# Patient Record
Sex: Female | Born: 1935 | Race: Black or African American | Hispanic: No | Smoking: Never smoker
Health system: Southern US, Community
[De-identification: ages and names within clinical notes are randomized; demographics above are authoritative.]

## PROBLEM LIST (undated history)

## (undated) DIAGNOSIS — N289 Disorder of kidney and ureter, unspecified: Secondary | ICD-10-CM

## (undated) DIAGNOSIS — C801 Malignant (primary) neoplasm, unspecified: Secondary | ICD-10-CM

## (undated) DIAGNOSIS — E039 Hypothyroidism, unspecified: Secondary | ICD-10-CM

## (undated) DIAGNOSIS — M1990 Unspecified osteoarthritis, unspecified site: Secondary | ICD-10-CM

## (undated) DIAGNOSIS — F32A Depression, unspecified: Secondary | ICD-10-CM

## (undated) DIAGNOSIS — F0390 Unspecified dementia without behavioral disturbance: Secondary | ICD-10-CM

## (undated) DIAGNOSIS — K219 Gastro-esophageal reflux disease without esophagitis: Secondary | ICD-10-CM

## (undated) DIAGNOSIS — I10 Essential (primary) hypertension: Secondary | ICD-10-CM

## (undated) DIAGNOSIS — F329 Major depressive disorder, single episode, unspecified: Secondary | ICD-10-CM

## (undated) DIAGNOSIS — F419 Anxiety disorder, unspecified: Secondary | ICD-10-CM

## (undated) DIAGNOSIS — J189 Pneumonia, unspecified organism: Secondary | ICD-10-CM

## (undated) DIAGNOSIS — J45909 Unspecified asthma, uncomplicated: Secondary | ICD-10-CM

## (undated) HISTORY — PX: KIDNEY STONE SURGERY: SHX686

## (undated) HISTORY — PX: CATARACT EXTRACTION: SUR2

---

## 2002-02-26 ENCOUNTER — Encounter: Payer: Self-pay | Admitting: Internal Medicine

## 2002-02-26 ENCOUNTER — Encounter: Admission: RE | Admit: 2002-02-26 | Discharge: 2002-02-26 | Payer: Self-pay | Admitting: Internal Medicine

## 2002-07-30 ENCOUNTER — Encounter: Payer: Self-pay | Admitting: Internal Medicine

## 2002-07-30 ENCOUNTER — Ambulatory Visit (HOSPITAL_COMMUNITY): Admission: RE | Admit: 2002-07-30 | Discharge: 2002-07-30 | Payer: Self-pay | Admitting: Internal Medicine

## 2006-06-15 ENCOUNTER — Ambulatory Visit (HOSPITAL_COMMUNITY): Admission: RE | Admit: 2006-06-15 | Discharge: 2006-06-15 | Payer: Self-pay | Admitting: Ophthalmology

## 2006-09-13 ENCOUNTER — Encounter: Admission: RE | Admit: 2006-09-13 | Discharge: 2006-09-13 | Payer: Self-pay | Admitting: Internal Medicine

## 2006-10-01 ENCOUNTER — Encounter: Admission: RE | Admit: 2006-10-01 | Discharge: 2006-10-01 | Payer: Self-pay | Admitting: Internal Medicine

## 2006-11-05 ENCOUNTER — Encounter: Admission: RE | Admit: 2006-11-05 | Discharge: 2006-11-05 | Payer: Self-pay | Admitting: Internal Medicine

## 2006-11-19 ENCOUNTER — Encounter: Admission: RE | Admit: 2006-11-19 | Discharge: 2006-11-19 | Payer: Self-pay | Admitting: Internal Medicine

## 2008-09-25 ENCOUNTER — Emergency Department (HOSPITAL_COMMUNITY): Admission: EM | Admit: 2008-09-25 | Discharge: 2008-09-25 | Payer: Self-pay | Admitting: Emergency Medicine

## 2008-09-26 ENCOUNTER — Emergency Department (HOSPITAL_COMMUNITY): Admission: EM | Admit: 2008-09-26 | Discharge: 2008-09-26 | Payer: Self-pay | Admitting: Emergency Medicine

## 2008-09-30 ENCOUNTER — Encounter: Payer: Self-pay | Admitting: Gastroenterology

## 2008-10-16 DIAGNOSIS — C801 Malignant (primary) neoplasm, unspecified: Secondary | ICD-10-CM

## 2008-10-16 HISTORY — PX: COLON RESECTION: SHX5231

## 2008-10-16 HISTORY — DX: Malignant (primary) neoplasm, unspecified: C80.1

## 2008-11-06 ENCOUNTER — Ambulatory Visit: Payer: Self-pay | Admitting: Gastroenterology

## 2008-11-06 ENCOUNTER — Encounter: Payer: Self-pay | Admitting: Gastroenterology

## 2008-11-06 ENCOUNTER — Ambulatory Visit (HOSPITAL_COMMUNITY): Admission: RE | Admit: 2008-11-06 | Discharge: 2008-11-06 | Payer: Self-pay | Admitting: Gastroenterology

## 2008-11-06 LAB — CONVERTED CEMR LAB
ALT: 9 units/L (ref 0–35)
Albumin: 4.3 g/dL (ref 3.5–5.2)
Alkaline Phosphatase: 105 units/L (ref 39–117)
CEA: 1.4 ng/mL (ref 0.0–5.0)
CO2: 22 meq/L (ref 19–32)
Creatinine, Ser: 0.88 mg/dL (ref 0.40–1.20)
Eosinophils Absolute: 0.2 10*3/uL (ref 0.0–0.7)
HCT: 37.8 % (ref 36.0–46.0)
Lymphs Abs: 2.2 10*3/uL (ref 0.7–4.0)
MCV: 86.1 fL (ref 78.0–100.0)
Neutro Abs: 3 10*3/uL (ref 1.7–7.7)
Neutrophils Relative %: 52 % (ref 43–77)
Platelets: 265 10*3/uL (ref 150–400)
Prothrombin Time: 12.8 s (ref 11.6–15.2)
RBC: 4.39 M/uL (ref 3.87–5.11)
RDW: 13.3 % (ref 11.5–15.5)
Sodium: 142 meq/L (ref 135–145)

## 2008-11-09 ENCOUNTER — Encounter: Payer: Self-pay | Admitting: Gastroenterology

## 2008-11-09 ENCOUNTER — Ambulatory Visit (HOSPITAL_COMMUNITY): Admission: RE | Admit: 2008-11-09 | Discharge: 2008-11-09 | Payer: Self-pay | Admitting: Gastroenterology

## 2008-11-10 ENCOUNTER — Encounter: Payer: Self-pay | Admitting: Gastroenterology

## 2008-11-10 DIAGNOSIS — C20 Malignant neoplasm of rectum: Secondary | ICD-10-CM

## 2008-11-12 ENCOUNTER — Ambulatory Visit: Payer: Self-pay | Admitting: Gastroenterology

## 2008-11-12 ENCOUNTER — Encounter: Payer: Self-pay | Admitting: Gastroenterology

## 2008-11-12 ENCOUNTER — Ambulatory Visit (HOSPITAL_COMMUNITY): Admission: RE | Admit: 2008-11-12 | Discharge: 2008-11-12 | Payer: Self-pay | Admitting: Gastroenterology

## 2008-11-23 ENCOUNTER — Encounter (INDEPENDENT_AMBULATORY_CARE_PROVIDER_SITE_OTHER): Payer: Self-pay | Admitting: General Surgery

## 2008-11-23 ENCOUNTER — Ambulatory Visit (HOSPITAL_COMMUNITY): Admission: RE | Admit: 2008-11-23 | Discharge: 2008-11-23 | Payer: Self-pay | Admitting: General Surgery

## 2008-12-14 ENCOUNTER — Encounter (HOSPITAL_COMMUNITY): Admission: RE | Admit: 2008-12-14 | Discharge: 2009-01-13 | Payer: Self-pay | Admitting: Oncology

## 2008-12-14 ENCOUNTER — Ambulatory Visit (HOSPITAL_COMMUNITY): Payer: Self-pay | Admitting: Oncology

## 2009-03-06 ENCOUNTER — Inpatient Hospital Stay (HOSPITAL_COMMUNITY): Admission: EM | Admit: 2009-03-06 | Discharge: 2009-03-10 | Payer: Self-pay | Admitting: Emergency Medicine

## 2009-03-07 ENCOUNTER — Ambulatory Visit: Payer: Self-pay | Admitting: Internal Medicine

## 2009-03-08 ENCOUNTER — Encounter: Payer: Self-pay | Admitting: Internal Medicine

## 2009-03-08 ENCOUNTER — Ambulatory Visit: Payer: Self-pay | Admitting: Internal Medicine

## 2009-03-08 HISTORY — PX: OTHER SURGICAL HISTORY: SHX169

## 2009-03-08 HISTORY — PX: ESOPHAGOGASTRODUODENOSCOPY: SHX1529

## 2009-03-09 ENCOUNTER — Ambulatory Visit: Payer: Self-pay | Admitting: Gastroenterology

## 2009-03-10 ENCOUNTER — Ambulatory Visit: Payer: Self-pay | Admitting: Gastroenterology

## 2009-03-12 ENCOUNTER — Encounter: Payer: Self-pay | Admitting: Internal Medicine

## 2009-04-26 DIAGNOSIS — M129 Arthropathy, unspecified: Secondary | ICD-10-CM | POA: Insufficient documentation

## 2009-04-26 DIAGNOSIS — I10 Essential (primary) hypertension: Secondary | ICD-10-CM | POA: Insufficient documentation

## 2009-04-27 ENCOUNTER — Ambulatory Visit: Payer: Self-pay | Admitting: Internal Medicine

## 2009-04-28 DIAGNOSIS — Z8719 Personal history of other diseases of the digestive system: Secondary | ICD-10-CM

## 2009-07-15 ENCOUNTER — Ambulatory Visit (HOSPITAL_COMMUNITY): Admission: RE | Admit: 2009-07-15 | Discharge: 2009-07-15 | Payer: Self-pay | Admitting: Internal Medicine

## 2009-07-21 ENCOUNTER — Encounter (INDEPENDENT_AMBULATORY_CARE_PROVIDER_SITE_OTHER): Payer: Self-pay | Admitting: Radiology

## 2009-07-21 ENCOUNTER — Encounter: Admission: RE | Admit: 2009-07-21 | Discharge: 2009-07-21 | Payer: Self-pay | Admitting: Internal Medicine

## 2010-01-20 ENCOUNTER — Encounter: Admission: RE | Admit: 2010-01-20 | Discharge: 2010-01-20 | Payer: Self-pay | Admitting: Internal Medicine

## 2010-03-03 ENCOUNTER — Encounter (INDEPENDENT_AMBULATORY_CARE_PROVIDER_SITE_OTHER): Payer: Self-pay

## 2010-11-06 ENCOUNTER — Encounter (INDEPENDENT_AMBULATORY_CARE_PROVIDER_SITE_OTHER): Payer: Self-pay | Admitting: Internal Medicine

## 2010-11-15 NOTE — Letter (Signed)
Summary: Recall Colonoscopy/Endoscopy, Change to Office Visit  [facility]  [address]   [place], [place] [address]   Phone: [phone]  Fax: [phone]      Mar 03, 2010   [name] Goodman [address] [place], [place]  [address] 1932-09-20   Dear Ms. Hollomon,   According to our records, it is time for you to schedule a Colonoscopy/Endoscopy.Please call the office and ask to  speak to the nurse to get this scheduled.    Sincerely,   Cloria Spring LPN  [facility] Ph: [phone]   Fax: [phone]

## 2011-01-24 LAB — BASIC METABOLIC PANEL
BUN: 12 mg/dL (ref 6–23)
BUN: 25 mg/dL — ABNORMAL HIGH (ref 6–23)
Calcium: 9.1 mg/dL (ref 8.4–10.5)
Chloride: 106 mEq/L (ref 96–112)
Chloride: 114 mEq/L — ABNORMAL HIGH (ref 96–112)
Creatinine, Ser: 1.25 mg/dL — ABNORMAL HIGH (ref 0.4–1.2)
GFR calc Af Amer: >60 mL/min (ref >60)
GFR calc non Af Amer: 42 mL/min — ABNORMAL LOW (ref >60)
GFR calc non Af Amer: >60 mL/min (ref >60)
Potassium: 3.8 mEq/L (ref 3.5–5.1)
Sodium: 139 mEq/L (ref 135–145)

## 2011-01-24 LAB — HEPATIC FUNCTION PANEL
ALT: 10 U/L (ref 0–35)
Alkaline Phosphatase: 97 U/L (ref 39–117)
Bilirubin, Direct: 0.1 mg/dL (ref 0.0–0.3)
Total Protein: 6.7 g/dL (ref 6.0–8.3)

## 2011-01-24 LAB — CBC
HCT: 27.2 % — ABNORMAL LOW (ref 36.0–46.0)
HCT: 28.9 % — ABNORMAL LOW (ref 36.0–46.0)
HCT: 29.8 % — ABNORMAL LOW (ref 36.0–46.0)
HCT: 30.7 % — ABNORMAL LOW (ref 36.0–46.0)
HCT: 31.3 % — ABNORMAL LOW (ref 36.0–46.0)
Hemoglobin: 10.3 g/dL — ABNORMAL LOW (ref 12.0–15.0)
Hemoglobin: 11.6 g/dL — ABNORMAL LOW (ref 12.0–15.0)
Hemoglobin: 9.5 g/dL — ABNORMAL LOW (ref 12.0–15.0)
Hemoglobin: 9.6 g/dL — ABNORMAL LOW (ref 12.0–15.0)
MCHC: 34.3 g/dL (ref 30.0–36.0)
MCHC: 34.5 g/dL (ref 30.0–36.0)
MCHC: 35.5 g/dL (ref 30.0–36.0)
MCV: 83.5 fL (ref 78.0–100.0)
MCV: 83.6 fL (ref 78.0–100.0)
MCV: 84.3 fL (ref 78.0–100.0)
MCV: 84.5 fL (ref 78.0–100.0)
Platelets: 117 10*3/uL — ABNORMAL LOW (ref 150–400)
Platelets: 161 10*3/uL (ref 150–400)
Platelets: 185 10*3/uL (ref 150–400)
Platelets: 206 10*3/uL (ref 150–400)
Platelets: 225 10*3/uL (ref 150–400)
RBC: 3.27 MIL/uL — ABNORMAL LOW (ref 3.87–5.11)
RBC: 3.46 MIL/uL — ABNORMAL LOW (ref 3.87–5.11)
RBC: 3.64 MIL/uL — ABNORMAL LOW (ref 3.87–5.11)
RBC: 3.75 MIL/uL — ABNORMAL LOW (ref 3.87–5.11)
RDW: 16 % — ABNORMAL HIGH (ref 11.5–15.5)
RDW: 16.1 % — ABNORMAL HIGH (ref 11.5–15.5)
RDW: 16.2 % — ABNORMAL HIGH (ref 11.5–15.5)
WBC: 4.8 10*3/uL (ref 4.0–10.5)
WBC: 5.1 10*3/uL (ref 4.0–10.5)
WBC: 5.5 10*3/uL (ref 4.0–10.5)
WBC: 5.6 10*3/uL (ref 4.0–10.5)

## 2011-01-24 LAB — DIFFERENTIAL
Basophils Absolute: 0 10*3/uL (ref 0.0–0.1)
Basophils Absolute: 0 10*3/uL (ref 0.0–0.1)
Basophils Absolute: 0 10*3/uL (ref 0.0–0.1)
Basophils Absolute: 0 10*3/uL (ref 0.0–0.1)
Basophils Relative: 0 % (ref 0–1)
Basophils Relative: 0 % (ref 0–1)
Basophils Relative: 0 % (ref 0–1)
Blasts: 0 %
Eosinophils Absolute: 0.1 10*3/uL (ref 0.0–0.7)
Eosinophils Absolute: 0.2 10*3/uL (ref 0.0–0.7)
Eosinophils Absolute: 0.2 10*3/uL (ref 0.0–0.7)
Eosinophils Absolute: 0.3 10*3/uL (ref 0.0–0.7)
Eosinophils Relative: 3 % (ref 0–5)
Eosinophils Relative: 4 % (ref 0–5)
Eosinophils Relative: 4 % (ref 0–5)
Lymphocytes Relative: 31 % (ref 12–46)
Lymphocytes Relative: 33 % (ref 12–46)
Lymphocytes Relative: 40 % (ref 12–46)
Lymphocytes Relative: 42 % (ref 12–46)
Lymphs Abs: 1.8 10*3/uL (ref 0.7–4.0)
Lymphs Abs: 2.2 10*3/uL (ref 0.7–4.0)
Lymphs Abs: 2.2 10*3/uL (ref 0.7–4.0)
Lymphs Abs: 2.4 10*3/uL (ref 0.7–4.0)
Monocytes Absolute: 0.4 10*3/uL (ref 0.1–1.0)
Monocytes Absolute: 0.4 10*3/uL (ref 0.1–1.0)
Monocytes Absolute: 0.4 10*3/uL (ref 0.1–1.0)
Monocytes Absolute: 0.5 10*3/uL (ref 0.1–1.0)
Monocytes Relative: 7 % (ref 3–12)
Monocytes Relative: 8 % (ref 3–12)
Monocytes Relative: 8 % (ref 3–12)
Monocytes Relative: 9 % (ref 3–12)
Myelocytes: 0 %
Neutro Abs: 2.9 10*3/uL (ref 1.7–7.7)
Neutro Abs: 3.5 10*3/uL (ref 1.7–7.7)
Neutro Abs: 4.9 10*3/uL (ref 1.7–7.7)
Neutrophils Relative %: 47 % (ref 43–77)
Neutrophils Relative %: 49 % (ref 43–77)
Neutrophils Relative %: 52 % (ref 43–77)
Neutrophils Relative %: 57 % (ref 43–77)
Neutrophils Relative %: 60 % (ref 43–77)
Promyelocytes Absolute: 0 %

## 2011-01-24 LAB — COMPREHENSIVE METABOLIC PANEL
ALT: 10 U/L (ref 0–35)
Alkaline Phosphatase: 78 U/L (ref 39–117)
CO2: 25 mEq/L (ref 19–32)
Calcium: 8.9 mg/dL (ref 8.4–10.5)
GFR calc non Af Amer: >60 mL/min (ref >60)
Glucose, Bld: 99 mg/dL (ref 70–99)
Potassium: 3.8 mEq/L (ref 3.5–5.1)
Sodium: 139 mEq/L (ref 135–145)

## 2011-01-24 LAB — PROTIME-INR: Prothrombin Time: 13.7 seconds (ref 11.6–15.2)

## 2011-01-24 LAB — APTT: aPTT: 28 seconds (ref 24–37)

## 2011-01-24 LAB — TYPE AND SCREEN
ABO/RH(D): B POS
Antibody Screen: NEGATIVE

## 2011-01-24 LAB — PHOSPHORUS: Phosphorus: 3.4 mg/dL (ref 2.3–4.6)

## 2011-01-31 LAB — BASIC METABOLIC PANEL
BUN: 13 mg/dL (ref 6–23)
CO2: 28 mEq/L (ref 19–32)
Calcium: 10 mg/dL (ref 8.4–10.5)
Creatinine, Ser: 0.82 mg/dL (ref 0.4–1.2)
GFR calc Af Amer: >60 mL/min (ref >60)
Glucose, Bld: 120 mg/dL — ABNORMAL HIGH (ref 70–99)

## 2011-01-31 LAB — CBC
MCHC: 33.7 g/dL (ref 30.0–36.0)
MCV: 85.4 fL (ref 78.0–100.0)
Platelets: 236 10*3/uL (ref 150–400)
RDW: 13.4 % (ref 11.5–15.5)

## 2011-02-28 NOTE — Consult Note (Signed)
NAME:  [name] Goodman, [name] Goodman                ACCOUNT NO.:  [id number]   MEDICAL RECORD NO.:  [id number]          PATIENT TYPE:  INP   LOCATION:  A341                          FACILITY:  [facility]   PHYSICIAN:  R. Roetta Sessions, M.D. DATE OF BIRTH:  [birth year]   DATE OF CONSULTATION:  DATE OF DISCHARGE:                                 CONSULTATION   REFERRING PHYSICIAN:  Dr. Sherle Poe.   REASON FOR CONSULTATION:  Hematochezia.  History of rectal cancer,  status post transanal resection in February this year.   HISTORY OF PRESENT ILLNESS:  [name] Goodman is a pleasant 77-year-old African American lady  old African American lady who was in her usual state of good health  until yesterday afternoon when she went to a family reunion a couple  hours from here.  She had a big meal, shortly thereafter experienced  some abdominal cramps and the urge to have a bowel movement.  She had  multiple episodes of dark maroon blood mixed with stool.  She had  several episodes last evening.  She presented to the emergency  department where she was evaluated by Dr. Bebe Shaggy.   She was found to be hemodynamically stable.  Hemoglobin and hematocrit  yesterday were 11.6 and 33.8.  This morning hemoglobin and hematocrit  10.9 and 31.3.  She has had no further rectal bleeding and again has  remained quite stable.  Her abdominal pain has also settled down.  She  has not had any hematemesis, odynophagia, dysphagia, early satiety,  reflux symptoms, or nausea/vomiting.  She denies frank melena.  This was  dark red stool yesterday.  It was noted that she does take meloxicam  daily for arthritis.   Her recent past GI history is significant for a bout of hematochezia  back in December of 2009 which triggered a GI evaluation by Dr. Dionicia Abler  over in [place].  He found a broad-based, 3-cm polyp in the distal rectum  which was piecemeal removed.  This was a villous adenoma and apparently  had invasive carcinoma within it.  She ultimately was redirected to  Dr.  Cira Servant, my partner, because of insurance issues who orchestrated the  endoscopic ultrasound by Dr. Wendall Papa and apparently ultrasound did  not reveal any residual tumor and biopsies were negative.  Ultimately,  she underwent a transanal resection of this area by Dr. Franky Macho in  February of this year and there was no evidence of carcinoma.   I do not have the actual endoscopy report by Dr. Dionicia Abler or Dr. Christella Hartigan EUS  report but apparently this was a low lesion as described by Dr. Lovell Sheehan'  operative note palpable on digital rectal exam.   PAST MEDICAL HISTORY:  Significant for:  1. Stage I rectal cancer described above.  2. Hypertension.  3. Osteoarthritis.   MEDICATIONS ON ADMISSION:  1. Lasix.  2. Clonidine.  3. Hydralazine.  4. Amlodipine.  5. Meloxicam daily.   ALLERGIES:  NO KNOWN DRUG ALLERGIES   FAMILY HISTORY:  Negative for chronic GI or liver illness.   SOCIAL HISTORY:  [place]  She is  followed primarily by Dr. Prescott Parma.   SOCIAL HISTORY:  Patient has a large family and multiple supportive  family members.  No alcohol, illicit drug use, or tobacco.   REVIEW OF SYSTEMS:  GI:  As above.  Has had recent chest pain, dyspnea  on exertion.  No fever, chills, or change in weight.   PHYSICAL EXAMINATION:  Pleasant elderly lady resting comfortably.  Temp  98.  Pulse 66.  BP 152/77.  Respiratory rate 18.  SKIN:  Warm and dry.  HEENT:  No scleral icterus.  Conjunctivae are slightly pink.  CHEST:  Lungs are clear to auscultation.  CARDIAC:  Regular rate and rhythm without murmur, gallop, or rub.  BREASTS:  Exam is deferred.  ABDOMEN:  Nondistended.  Positive bowel sounds.  Soft.  She has vague  left-sided abdominal tenderness to deep palpation.  No appreciable mass  or organomegaly.  EXTREMITIES:  Have no edema.   ADDITIONAL LABORATORY DATA:  CBC from this morning white count 5.5,  hemoglobin and hematocrit 10.9 and 31.3,  MCV 83.5, platelet count  206,000.  INR 1.0.  Hepatic profile, bilirubin 0.2, alkaline phosphatase  97, AST/ALT 18 and 10 respectively, albumin 3.7.   IMPRESSION:  [name] Goodman is a very pleasant 77 year old lady  admitted to the hospital with an acute illness characterized by  lightheadedness, abdominal cramps followed by dark maroon stool and  clots per rectum.  She has had a decrement in her hemoglobin but remains  hemodynamically stable.  She has a history of stage I rectal cancer with  transanal resection of this area back in February of this year by Dr.  Lovell Sheehan.   She take meloxicam daily for arthritis.   I suspect that her acute illness will be found to be secondary to  ischemic or segmental colitis.  I do not think that her recent history  of stage I rectal carcinoma has anything to do with this process.  I  suppose she could have a food-borne illness.  In addition, she does take  meloxicam and could have an insult anywhere along her gastrointestinal  tract including a bleeding peptic ulcer but again her symptoms and the  clinical scenario are most consistent with ischemic or segmental  colitis.   RECOMMENDATIONS:  [name] Goodman needs to have another colonoscopy now.  I  have discussed this approach with [name] Goodman and her daughter who  accompanies her today, Darlina Sicilian.  Risks, benefits, alternatives,  limitations have been discussed.  Her questions were answered.  She is  agreeable.  I told [name] Goodman and her daughter that if colonoscopy  happens to be unrevealing as the cause of her bleeding then she would  undergo a subsequent EGD at the same time.  They also are agreeable to  this approach.  I also agree with current symptomatic approach being  employed.  I agree with ongoing therapy with PPI therapy empirically.   I would like to thank Dr. Sherle Poe for allowing me to see this nice  lady again in consultation.  Further recommendations to follow.       Jonathon Bellows, M.D.  Electronically Signed     RMR/MEDQ  D:  03/07/2009  T:  03/07/2009  Job:  [id number]   cc:   Prescott Parma  Fax: [phone]   Dalia Heading, M.D.  Fax: [phone]

## 2011-02-28 NOTE — H&P (Signed)
NAME:  [name] Goodman, [name] Goodman                ACCOUNT NO.:  [id number]   MEDICAL RECORD NO.:  [id number]          PATIENT TYPE:  AMB   LOCATION:  DAY                           FACILITY:  [facility]   PHYSICIAN:  Dalia Heading, M.D.  DATE OF BIRTH:  1932-02-20   DATE OF ADMISSION:  DATE OF DISCHARGE:  LH                              HISTORY & PHYSICAL   CHIEF COMPLAINT:  Rectal carcinoma, stage I   HISTORY OF PRESENT ILLNESS:  The patient is a 77 year old black female  who is referred for evaluation and treatment of the rectal carcinoma.  This was done and colonoscopy for hematochezia by Dr. Karilyn Cota in December  2009.  Invasive carcinoma was found, but the specimen was piecemeal.  Recent rectal ultrasound with biopsies was done by Dr. Lovell Sheehan in  [place] was negative for carcinoma.  CT scan of the abdomen and  pelvis reveals no metastatic disease.  Preoperative CEA level is 1.4.  No further hematochezia is noted by the patient.  There is no family  history of colon carcinoma.   PAST MEDICAL HISTORY:  Hypertension and arthritis.   PAST SURGICAL HISTORY:  As noted above.   CURRENT MEDICATIONS:  1. Amlodipine 5 mg p.o. daily.  2. Hydralazine 25 mg p.o. t.i.d.  3. Clonidine 0.1 mg p.o. nightly.  4. Meloxicam 15 mg p.o. daily.  5. Lasix 40 mg p.o. daily   ALLERGIES:  No known drug allergies.   REVIEW OF SYSTEMS:  The patient denies any drinking or smoking.  She  denies any other cardiopulmonary difficulties or bleeding disorders.   PHYSICAL EXAMINATION:  GENERAL:  The patient is a well-developed, well-  nourished black female in no acute distress.  NECK:  Supple without lymphadenopathy.  LUNGS:  Clear to auscultation with equal breath sounds bilaterally.  HEART:  A regular rate and rhythm without S3, S4, or murmurs.  ABDOMEN:  Soft, nontender, and nondistended.  No hepatosplenomegaly,  masses, or hernias are identified.  RECTAL:  No palpable mass noted.   Pathology in operative notes were  reviewed.   IMPRESSION:  Rectal carcinoma, stage I.   PLAN:  The patient is scheduled for transanal excision of the rectal  neoplasm on November 23, 2008.  The risks and benefits of the procedure  including bleeding, infection, perforation, and the possibility of  remaining cancer cells were fully explained to the patient, gave  informed consent.  The patient's daughter was also present.  They  understand that she may need formal excision of the area with or without  colostomy in the future should the cancer recur.  She will need a  followup rectal ultrasound in the future.      Dalia Heading, M.D.  Electronically Signed     MAJ/MEDQ  D:  11/19/2008  T:  11/20/2008  Job:  [id number]   cc:   Short Stay  Jeani Hawking   Lionel December, M.D.  Fax: [phone]   Kassie Mends, M.D.  [address]  [place] , [place] [address]   Rachael Fee, MD  [address]  [place], [place] [address]  Prescott Parma  Fax: [phone]

## 2011-02-28 NOTE — Discharge Summary (Signed)
[name] Goodman, [name] Goodman                ACCOUNT NO.:  [id number]   MEDICAL RECORD NO.:  [id number]          PATIENT TYPE:  INP   LOCATION:  A341                          FACILITY:  [facility]   PHYSICIAN:  Dorris Singh, DO    DATE OF BIRTH:  [birth year]   DATE OF ADMISSION:  03/06/2009  DATE OF DISCHARGE:  05/26/2010LH                               DISCHARGE SUMMARY   ADMISSION DIAGNOSES:  1. Frank rectal bleeding or lower gastrointestinal bleed.  2. Hypertension.   DISCHARGE DIAGNOSES:  1. Gastrointestinal bleed.  2. Diverticulosis.  3. Hypertension.  4. Allergies.   PRIMARY CARE PHYSICIAN:  Prescott Parma, MD and Dr. Jena Gauss was consulted  and Dr. Cira Servant also consulted.   HOSPITAL COURSE:  The patient was admitted with the above.  She was  started on serial CBG.  She was started on IV fluids, also any  medications that could contribute to this were held.  She had a  colonoscopy on Mar 08, 2009, ileocolonoscopy with snare polypectomy  followed by diagnostic EGD.  The findings showed no mucosal  abnormalities.  Gastric cavity was empty and insufflated there.  The  gastric mucosa include retroflexed and a proximal stomach.  Esophageal  gastric junction demonstrated only a small hernia.  Gastric mucosa  appeared fairly normal, pylorus patent, easily traversed and examination  of the bulb, second and third portion revealed no abnormalities.  EGD  findings are normal esophagus, small hiatal hernia, otherwise normal  stomach, normal D1 to D3.  She did have a polyp removal.  She continued  to improve.  It was recommended that she started on Protonix and they  are still waiting pathology report for her discharge though the patient  will be discharged today on the following medications which include:  1. Lasix 40 mg p.o. daily.  2. Clonidine 0.1 mg at bedtime.  3. Hydralazine 25 mg 2 times a day.  4. Amlodipine 5 mg daily.  5. Singulair 10 mg daily.  6. Stool softener daily.   She will not  continue the meloxicam 15 mg.  She will use Tylenol  Arthritis as needed.  No aspirin or Goody powders until April 07, 2009,  and she will also start on Protonix 1 p.o. daily.  She will follow up  with Dr. Luvenia Starch office in 4 weeks for review of her pathology report.  She is to follow up with Dr. Dyann Ruddle as well within the next week.      Dorris Singh, DO  Electronically Signed     CB/MEDQ  D:  03/10/2009  T:  03/11/2009  Job:  [id number]

## 2011-02-28 NOTE — Group Therapy Note (Signed)
NAMEMARSIA, [name] Goodman                ACCOUNT NO.:  [id number]   MEDICAL RECORD NO.:  [id number]          PATIENT TYPE:  INP   LOCATION:  A341                          FACILITY:  [facility]   PHYSICIAN:  Margaretmary Dys, M.D.DATE OF BIRTH:  03-Oct-1932   DATE OF PROCEDURE:  03/07/2009  DATE OF DISCHARGE:                                 PROGRESS NOTE   SUBJECTIVE:  The patient feels better today.  Has not had any more  bloody stools per rectum.   The patient denies any abdominal pain.  She only had a single episode  back at home for which she presented to the emergency room.  We  discussed care with Dr. Jena Gauss and he will plan for a colonoscopy and  even possibly an upper endoscopy if the colonoscopy is negative.  The  patient is at significant risk for recurrence and we will follow  closely.   OBJECTIVE:  Conscious, alert, comfortable, not in acute distress.  VITAL SIGNS:  Blood pressure is 152/77 with a pulse of 57, respirations  18, temperature 98.3 degrees Fahrenheit.  Oxygen saturation was 99% on  room air.  HEENT:  Exam normocephalic, atraumatic.  Oral mucosa was dry.  No  exudates.  NECK:  Supple.  No JVD or lymphadenopathy.  LUNGS:  Were clear.  Clinically good air entry bilaterally.  HEART:  S1, S2 regular.  No S3, S4, gallops or rubs.  ABDOMEN:  Soft, nontender, although some vague tenderness in deep  palpation to the left side, left lower quadrant.  No mass was palpable.  EXTREMITIES:  No edema.  No calf induration.   LABORATORY DATA:  White blood count 5.1, hemoglobin of 10.6, hematocrit  of 30.7, platelet count was 192.  PT was 13.7, INR was 1.0.  Rest of the  laboratories were unremarkable.   ASSESSMENT/PLAN:  1. Gastrointestinal bleed, possibly lower GI.  2. History of rectal cancer.  3. Hypertension.   PLAN:  1. I agree to  hold the patient's Meloxicam at this time.  2. The patient is scheduled for colonoscopy per Dr. Jena Gauss.  3. Continue on intravenous fluids  therapy.  4. The patient does not require blood transfusion at this time and      remains mostly hemodynamically stable.  5. Discussed her care with Dr. Lovell Sheehan who felt there was nothing      surgical at this time but will be on standby depending on      colonoscopic findings. e      Margaretmary Dys, M.D.  Electronically Signed     AM/MEDQ  D:  03/07/2009  T:  03/07/2009  Job:  [id number]

## 2011-02-28 NOTE — Op Note (Signed)
NAME:  [name], Goodman                ACCOUNT NO.:  [id number]   MEDICAL RECORD NO.:  [id number]          PATIENT TYPE:  INP   LOCATION:  A341                          FACILITY:  [facility]   PHYSICIAN:  R. Roetta Sessions, M.D. DATE OF BIRTH:  02/25/1932   DATE OF PROCEDURE:  03/08/2009  DATE OF DISCHARGE:                               OPERATIVE REPORT   PROCEDURE PERFORMED:  Ileocolonoscopy and snare polypectomy followed by  diagnostic EGD.   INDICATIONS FOR PROCEDURE:  The patient is a 77 year old lady admitted  to the hospital with some vague abdominal cramps followed by  hematochezia.  She has had a stable hemoglobin over the past 2 days in  the 10 range on May 22, hemoglobin was 11.6.  It is 10.3 today.  It was  10.6 yesterday.  She has remained stable and the bleeding has ceased.  She does take Meloxicam on a regular basis.  She has a history of  invasive carcinoma and a rectal polyp removed piecemeal by Dr. Karilyn Cota  back in December of 2009.  Subsequent endoscopic ultrasound with biopsy  failed to demonstrate any residual tumor.  This area was transanally  resected by Dr. Lovell Sheehan in February this year, also biopsies benign.  She has not had any chronic rectal bleeding.  She has not had any  melena.  She has not had any hematemesis.  Colonoscopy is now being  done.  Colonoscopy unremarkable for obvious bleeding site.  She will  undergo an EGD.  Risks, benefits, alternatives and limitations were  reviewed with her and her daughter yesterday and again today at the  bedside.  All questions answered and all parties agreeable.   PROCEDURE NOTE:  O2 saturation, blood pressure, pulse, respiration  monitored throughout entire procedure.  Conscious sedation Versed 3 mg  IV, Demerol 50 mg IV in divided doses.   INSTRUMENT:  Pentax video chip system.   Cetacaine spray for topical pharyngeal anesthesia.   COLONOSCOPY FINDINGS:  Digital rectal exam revealed no abnormalities.   ENDOSCOPIC  FINDINGS:  The prep was good.  At approximately 6 cm from the  anal verge there was scar most likely representing site of prior  transanal resection polypectomy.  At 5 cm (on the edge of the scar) from  the anal verge there was a 6-mm polypoid lesion.  Please see photos.  The remainder of the rectal mucosa was thoroughly examined and  completely seen.  This was the only abnormality.  Colon:  Colonic mucosa was surveyed from the rectosigmoid junction  through the left, transverse, right colon to the appendiceal orifice,  ileocecal valve and cecum.  These structures well seen and photographed  for the record.  Terminal ileum was elevated to 10 cm.  From this level  scope was slowly cautious cautiously withdrawn.  All previously  mentioned mucosal surfaces were again seen.  There was small  pedunculated polyp mid ascending colon which was cold biopsy/removed.  There were diverticula from the cecum all the way to the rectosigmoid.  There was no blood in the lower GI tract.  There were no other colonic  mucosal abnormalities.  Terminal ileal mucosa appeared normal.  Scope  was pulled down in the rectum and findings in the rectum were  reconfirmed and subsequently the polypoid lesion in the rectum was  resected with hot snare cautery with one pass and specimen was recovered  for pathologist.  The patient tolerated the procedure well and was  prepared for EGD.   FINDINGS:  Examination of tubular esophagus revealed no mucosal  abnormalities.  EG junction easily traversed.  Stomach:  Gastric cavity was empty it insufflated well with air.  The  gastric mucosa including retroflex in the proximal stomach,  esophagogastric junction demonstrated only a small hiatal hernia.  Gastric mucosa appeared fairly normal.  Pylorus patent, easily  traversed.  Examination of the bulb, second, third portion revealed no  abnormalities.   THERAPEUTIC/DIAGNOSTIC MANEUVERS PERFORMED:  None.   The patient tolerated  both procedures well, was reacted in endoscopy.   IMPRESSION:  1. Some surgical scar present in rectal mucosa with polypoid lesion      overlying it as described above, status post a clean resection as      described above, otherwise unremarkable rectum.  2. Pan colonic diverticula, remainder of colonic mucosa appeared      normal aside from ascending colon polyp status post cold snare      removal, normal terminal ileum.   EGD FINDINGS:  Normal esophagus, small hiatal hernia, otherwise normal  stomach, normal D1 through D3.   RECOMMENDATIONS:  1. [name] Goodman should refrain from taking Meloxicam and all NSAIDs for      the time being.  Will recheck hemoglobin and hematocrit tomorrow      morning.  2. Follow-up on path.  At this point, etiology of her bleeding is not      well-defined.  She could have easily had diverticular bleed.  She      could an NSAID insult to her small bowel as well.  I doubt the      lesion in the rectum has produced a significant bleeding recently.      Will make further recommendations once we have the above-mentioned      data available for review.  __________      R. Roetta Sessions, M.D.  Electronically Signed     RMR/MEDQ  D:  03/08/2009  T:  03/08/2009  Job:  [id number]   cc:   Dr. Lovell Sheehan

## 2011-02-28 NOTE — Op Note (Signed)
NAME:  [name] Goodman, [name] Goodman                ACCOUNT NO.:  [id number]   MEDICAL RECORD NO.:  [id number]          PATIENT TYPE:  AMB   LOCATION:  DAY                           FACILITY:  [facility]   PHYSICIAN:  Dalia Heading, M.D.  DATE OF BIRTH:  1932-03-26   DATE OF PROCEDURE:  DATE OF DISCHARGE:                               OPERATIVE REPORT   PREOPERATIVE DIAGNOSIS:  Rectal carcinoma, stage I.   POSTOPERATIVE DIAGNOSIS:  Rectal carcinoma, stage I.   PROCEDURE:  Transanal excision of rectal neoplasm.   SURGEON:  Dalia Heading, MD   ANESTHESIA:  Spinal.   INDICATIONS:  The patient is a 77 year old black female who is referred  for treatment of the rectal carcinoma.  She had a colonoscopy for  hematochezia in 2009 and was found to have invasive carcinoma.  Recent  ultrasound biopsy transanally by ultrasound was done by Dr. Christella Hartigan in  [place] and was negative for cancer.  The patient now comes to the  operating room for further excision of the mucosa in this region.  Risks  and benefits of the procedure including bleeding, infection,  perforation, and the possible recurrence of the cancer were fully  explained to the patient, gave informed consent.   PROCEDURE NOTE:  The patient was placed in lithotomy position after  spinal anesthesia was administered.  The perineum was prepped and draped  using the usual sterile technique with Betadine.  Surgical site  confirmation was performed.   On digital palpation, the previous area of excision was palpated  approximately at the 2 o'clock position.  It was mobile.  Blue dye was  noted in this region and there appeared to be a granuloma formed from  the blue dye injection.  The linear area of excision was then grasped  with an Allis and was excised using a LigaSure.  The specimen was sent  to Pathology for further examination.  No abnormal bleeding was noted at  the end of the procedure.  There was no evidence of perforation.  No  other mass  lesions were noted.  The rectum was packed with Surgicel and  viscous Xylocaine packing.   All tape and needle counts were correct at the end of the procedure.  The patient was transferred to PACU in stable condition.   COMPLICATIONS:  None.   SPECIMEN:  Rectal tissue.   BLOOD LOSS:  Minimal.      Dalia Heading, M.D.  Electronically Signed     MAJ/MEDQ  D:  11/23/2008  T:  11/24/2008  Job:  [id number]   cc:   Lionel December, M.D.  Fax: [phone]   Kassie Mends, M.D.  [address]  [place] , [place] [address]   Rachael Fee, MD  [address]  [place], [place] [address]   Prescott Parma  Fax: [phone]

## 2011-02-28 NOTE — Consult Note (Signed)
NAMEISABELLAROSE, KOPE                ACCOUNT NO.:  [id number]   MEDICAL RECORD NO.:  [id number]          PATIENT TYPE:  AMB   LOCATION:  DAY                           FACILITY:  [facility]   PHYSICIAN:  Kassie Mends, M.D.      DATE OF BIRTH:  1932/06/16   DATE OF CONSULTATION:  11/06/2008  DATE OF DISCHARGE:                                 CONSULTATION   REFERRING PHYSICIAN:  Dr. Dyann Ruddle.   REASON FOR CONSULTATION:  Colorectal cancer.   HISTORY OF PRESENT ILLNESS:  Ms. Jaycox is a 77 year old female who has  never had a colonoscopy.  She presented to the emergency department on  December 11 with rectal bleeding.  Hemoglobin was noted to be 11.6.  She  referred to Dr. Lionel December on September 30, 2008, and a colonoscopy  was performed.  It was noted that she had a 3-cm wide-based polyp at the  distal rectum.  The polyp was removed piecemeal.  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  Her  care has been delayed because she needed to have a Jaycob Mcclenton that was in  network, and Dr. Karilyn Cota and [facility] are not in the  insurance network.   She is no longer having any bleeding.  She denies any abdominal pain.  Appetite is good.  She denies any nausea or vomiting.  She eats 1 meal a  day for the last 4 years.  She has had a 4-pound weight loss.  She has  had no additional workup.   PAST MEDICAL HISTORY:  1. Hypertension.  2. Arthritis.   PAST HISTORY:  None.   ALLERGIES:  No known drug allergies.   MEDICATIONS:  1. Amlodipine 5 mg daily.  2. Hydralazine 25 mg t.i.d.  3. Clonidine 0.1 mg every night.  4. Meloxicam 15 mg daily.  5. Lasix 40 mg daily.   FAMILY HISTORY:  She has no family history of colon cancer or colon  polyps.   SOCIAL HISTORY:  She has been widowed since 66.  She has 4 children, all  over the age of 71.  She is to work as a Lawyer.  She denies any tobacco or  alcohol use.   REVIEW OF SYSTEMS:  As per  the HPI; otherwise, all systems are negative.   PHYSICAL EXAMINATION:  Weight 169 pounds, height 5 feet 4 inches, BMI 29  (overweight), temperature 97.6, blood pressure 158/70, pulse 72.  In general, she is in no apparent distress, alert and orient x4.  HEENT  EXAM:  Is atraumatic, normocephalic.  Pupils equal, react to light.  Mouth no oral lesions.  Posterior pharynx without erythema or exudate.  She does have dentures.  NECK:  Full range of motion.  No  lymphadenopathy.  LUNGS:  Clear to auscultation bilaterally.  CARDIOVASCULAR:  Regular rhythm.  No murmur.  Normal S1-S2.  ABDOMEN:  Bowel sounds present, soft, obese, nontender, nondistended.  No  abdominal bruits or hepatosplenomegaly.  EXTREMITIES:  No cyanosis or  edema.  NEUROLOGICAL:  She has no focal neurologic deficits.   LABORATORY DATA:  From December 2009:  Urinalysis negative.  Potassium  4.6, creatinine 0.84.  White count 5.5, hemoglobin 11.6 with an MCV of  86.8, platelet count 246.   ASSESSMENT:  Ms. Szydlowski is a 77 year old female who has invasive rectal  carcinoma.  The colonoscopy report is not identified.  The distance from  the anal verge and the polypectomy site was not tattooed.  Thank you for  allowing me to see Ms. Shutt in consultation.  My recommendations  follow.   RECOMMENDATIONS:  1. Will schedule rectal ultrasound as soon as possible with a Delmy Holdren      who is in network.  The rectal ultrasound will be at an attempt to      evaluate how invasive her adenocarcinoma it.  Since the polyp was      removed piecemeal, the margins were not clear.  The lesion should      also be tattooed to give the surgeon an idea as to whether or not      she will end up with a colostomy.  2. Will check a CBC, complete metabolic panel, PT/PTT and CEA.  3. She was scheduled for chest x-ray PA and lateral and CT scan of the      abdomen and pelvis with IV and oral contrast as soon as possible      for staging.  4. Will  schedule for an appointment to see Dr. Franky Macho to      evaluate for left hemicolectomy or a low anterior resection.  5. Follow-up appointment in 1 month.   [id number]:  CT Scan of C/A/P with iv and oral contrast: rectal mass,  non-pathologic pelvic lymph nodes.      Kassie Mends, M.D.  Electronically Signed     SM/MEDQ  D:  11/06/2008  T:  11/06/2008  Job:  [id number]   cc:   Dalia Heading, M.D.  Fax: [phone]   Rachael Fee, MD  [address]  [place], [place] [address]

## 2011-02-28 NOTE — H&P (Signed)
[name], [name]                ACCOUNT NO.:  [id number]   MEDICAL RECORD NO.:  [id number]          PATIENT TYPE:  INP   LOCATION:  A341                          FACILITY:  [facility]   PHYSICIAN:  Lonia Blood, M.D.      DATE OF BIRTH:  23-Jan-1932   DATE OF ADMISSION:  03/06/2009  DATE OF DISCHARGE:  LH                              HISTORY & PHYSICAL   PRIMARY CARE PHYSICIAN:  Dr. Dyann Ruddle.   SURGEON:  Dr. Lovell Sheehan.   PRESENTING COMPLAINT:  Rectal bleed.   HISTORY OF PRESENT ILLNESS:  The patient is a 77 year old female who was  recently diagnosed with rectal cancer back in December.  She is status  post resection.  Pathology showed stage I with no metastasis at the  time.  Since February, she has been doing fine until tonight when she  went to the bathroom after she went to a family dinner.  During the  family dinner she felt a little bit dizzy and weak.  She also felt some  abdominal cramping.  She subsequently felt better and went home.  Her  pain, however, returned later and patient could not contain herself.  She went to the bathroom and instead of regular stool the toilet was  full of blood.  She subsequently came to the emergency room.  Denied any  recent dizziness.  No recurrence of her bleeding.  No nausea, vomiting.  No recent constipation.  No fever, no chills.  The patient also denied  any cough.  No chest pain.   PAST MEDICAL HISTORY:  Significant for:  1. Rectal cancer.  2. Hypertension.  3. Arthritis.   ALLERGIES:  She has no known drug allergies.   MEDICATIONS:  1. Lasix 40 mg daily.  2. Clonidine 0.1 mg at night.  3. Hydralazine 25 mg p.o. t.i.d.  4. Amlodipine 5 mg daily.  5. Meloxicam 50 mg daily.   SOCIAL HISTORY:  [place]  Denied  any tobacco, alcohol or IV drug use.   FAMILY HISTORY:  Nonsignificant.  She has 9 other siblings, all  relatively healthy.   REVIEW OF SYSTEMS:  A 14-point review of systems is negative  except for  HPI.   PHYSICAL EXAMINATION:  Temperature 97.8, blood pressure 156/85, pulse  68, respiratory rate 18, saturations 99% on room air.  GENERAL:  The patient is a pleasant woman in no acute distress.  HEENT:  PERRL.  EOMI.  NECK:  Supple.  No JVD, no lymphadenopathy.  RESPIRATORY:  She has good air entry bilaterally.  No wheezes, no rales.  CARDIOVASCULAR:  The patient has S1-S2, no murmur.  ABDOMEN:  Soft and nontender with positive bowel sounds.  EXTREMITIES:  Show no edema, cyanosis or clubbing.   INITIAL LABS:  Showed white count 8.2, hemoglobin 11.6, platelet count  225 with normal differentials.   ASSESSMENT:  This is a 77 year old female with known history of rectal  cancer now presenting with frank rectal bleeding.  The patient's  bleeding most likely is lower gastrointestinal, based on the  description.  With recent  resection two months ago, the differentials  are varied.  The number one thing will be bleeding from her site of  surgery, she could also have some diverticular disease from where she  was bleeding.  So far she seems to be stable both hemodynamically and  clinically.   PLAN:  1. Lower GI bleed.  Will admit the patient to a monitored bed.  Two      wide bore IVs.  IV Protonix.  Serial CBCs.  Will get GI consult as      soon as possible.  The patient will benefit from repeat colonoscopy      to take a look at the site.  Will probably also get surgical      consult depending on the results.  Meanwhile, I will type and      crossmatch her and hold blood.  If her hemoglobin drops to less      than 8, would go ahead and transfuse her.  2. Hypertension.  I will hold her medications for now, though as soon      as her GI bleed stabilizes I will restart her antihypertensives.      History of rectal cancer.  Again, there was no metastasis last      time, it was stage I.  Depending on the results of the colonoscopy,      will proceed with further testing.   Otherwise, further treatment      will depend on how patient does in the hospital.      Lonia Blood, M.D.  Electronically Signed     LG/MEDQ  D:  03/07/2009  T:  03/07/2009  Job:  [id number]

## 2011-03-03 NOTE — Op Note (Signed)
[name] Goodman, [name] Goodman                ACCOUNT NO.:  [id number]   MEDICAL RECORD NO.:  [id number]          PATIENT TYPE:  AMB   LOCATION:  DAY                           FACILITY:  [facility]   PHYSICIAN:  Susanne Greenhouse, MD       DATE OF BIRTH:  January 10, 1932   DATE OF PROCEDURE:  06/15/2006  DATE OF DISCHARGE:  06/15/2006                                 OPERATIVE REPORT   PREOPERATIVE DIAGNOSIS:  Nuclear cataract, left eye.   POSTOPERATIVE DIAGNOSIS:  Nuclear cataract, left eye.   SURGEON:  Bonne Dolores. Hunt, MD.   OPERATION PERFORMED:  Phacoemulsification posterior chamber intraocular lens  implantation, left  eye.   ANESTHESIA:  Topical with monitored anesthesia care.   DESCRIPTION OF PROCEDURE:  In the preoperative holding area, dilating drops  and 2% viscus lidocaine were placed into the left eye. The patient was then  brought to the operating room where the eye was prepped and draped. A super  sharp blade was used to make a paracentesis port at the surgeon's 2 o'clock  position. Because the anterior chamber was very shallow, an intentional long  scleral tunnel was created with a 2.85 mm Keratome blade. The anterior  chamber was filled with a 1% nonpreserved lidocaine solution followed by  Amvisc Plus. A cystotome needle and Utrata forceps were used to create a  continuous tear capsulotomy. Hydrodissection was performed using balanced  salt solution on a fine cannula. During hydrodissection, iris prolapse  occurred through the main incision. The iris was returned to its natural  position using the viscoelastic. The lens nucleus was then removed using  phacoemulsification and a quadrant cracking technique. Residual cortex was  removed with irrigation and aspiration. The capsular bag and anterior  chamber were refilled with Amvisc Plus. A posterior chamber lens was placed  into the capsular bag using a Monarch lens injecting system. The Amvisc Plus  was then removed from the capsular bag and  anterior chamber with irrigation  and aspiration. Stromal hydration of the main incision and paracentesis  ports were performed with balanced salt solution on a fine cannula. The  wound was tested for leak which was negative. No sutures were placed. There  were no operative complications. The patient tolerated the procedure well  and she was returned to the recovery area in satisfactory condition. The  prosthetic device is an Alcon AcrySof posterior chamber intraocular lens  model [id number], power of 26.0, serial #[id number].           ______________________________  Susanne Greenhouse, MD     KEH/MEDQ  D:  06/15/2006  T:  06/16/2006  Job:  [id number]

## 2011-07-21 LAB — BASIC METABOLIC PANEL
BUN: 18 mg/dL (ref 6–23)
CO2: 27 mEq/L (ref 19–32)
Calcium: 10.6 mg/dL — ABNORMAL HIGH (ref 8.4–10.5)
GFR calc non Af Amer: >60 mL/min (ref >60)
Glucose, Bld: 105 mg/dL — ABNORMAL HIGH (ref 70–99)

## 2011-07-21 LAB — DIFFERENTIAL
Lymphocytes Relative: 40 % (ref 12–46)
Monocytes Absolute: 0.5 10*3/uL (ref 0.1–1.0)
Monocytes Relative: 8 % (ref 3–12)
Neutro Abs: 2.7 10*3/uL (ref 1.7–7.7)

## 2011-07-21 LAB — SAMPLE TO BLOOD BANK

## 2011-07-21 LAB — URINALYSIS, ROUTINE W REFLEX MICROSCOPIC
Ketones, ur: NEGATIVE mg/dL
Protein, ur: NEGATIVE mg/dL
Urobilinogen, UA: 0.2 mg/dL (ref 0.0–1.0)

## 2011-07-21 LAB — CBC
HCT: 34.6 % — ABNORMAL LOW (ref 36.0–46.0)
MCHC: 33.6 g/dL (ref 30.0–36.0)
Platelets: 246 10*3/uL (ref 150–400)
RDW: 13.6 % (ref 11.5–15.5)

## 2011-10-09 DIAGNOSIS — R197 Diarrhea, unspecified: Secondary | ICD-10-CM | POA: Insufficient documentation

## 2011-10-09 DIAGNOSIS — J189 Pneumonia, unspecified organism: Secondary | ICD-10-CM | POA: Insufficient documentation

## 2011-10-09 DIAGNOSIS — Z79899 Other long term (current) drug therapy: Secondary | ICD-10-CM | POA: Insufficient documentation

## 2011-10-09 DIAGNOSIS — R509 Fever, unspecified: Secondary | ICD-10-CM | POA: Insufficient documentation

## 2011-10-09 DIAGNOSIS — I10 Essential (primary) hypertension: Secondary | ICD-10-CM | POA: Insufficient documentation

## 2011-10-09 NOTE — ED Notes (Signed)
Patient complaining of cough, congestion, fever, vomiting, and diarrhea x 2 days.

## 2011-10-10 ENCOUNTER — Emergency Department (HOSPITAL_COMMUNITY): Payer: PRIVATE HEALTH INSURANCE

## 2011-10-10 ENCOUNTER — Emergency Department (HOSPITAL_COMMUNITY)
Admission: EM | Admit: 2011-10-10 | Discharge: 2011-10-10 | Disposition: A | Discharge status: Home / Self Care | Payer: PRIVATE HEALTH INSURANCE | Attending: Emergency Medicine | Admitting: Emergency Medicine

## 2011-10-10 ENCOUNTER — Encounter: Payer: Self-pay | Admitting: Emergency Medicine

## 2011-10-10 DIAGNOSIS — J189 Pneumonia, unspecified organism: Secondary | ICD-10-CM

## 2011-10-10 HISTORY — DX: Essential (primary) hypertension: I10

## 2011-10-10 LAB — CBC
HCT: 41.2 % (ref 36.0–46.0)
Hemoglobin: 13.9 g/dL (ref 12.0–15.0)
MCH: 28.9 pg (ref 26.0–34.0)
MCHC: 33.7 g/dL (ref 30.0–36.0)
MCV: 85.7 fL (ref 78.0–100.0)

## 2011-10-10 LAB — BASIC METABOLIC PANEL
BUN: 12 mg/dL (ref 6–23)
Chloride: 98 mEq/L (ref 96–112)
Creatinine, Ser: 0.93 mg/dL (ref 0.50–1.10)
GFR calc Af Amer: 67 mL/min — ABNORMAL LOW (ref >90)
GFR calc non Af Amer: 58 mL/min — ABNORMAL LOW (ref >90)

## 2011-10-10 LAB — DIFFERENTIAL
Basophils Relative: 0 % (ref 0–1)
Eosinophils Absolute: 0 10*3/uL (ref 0.0–0.7)
Eosinophils Relative: 0 % (ref 0–5)
Monocytes Absolute: 0.3 10*3/uL (ref 0.1–1.0)
Monocytes Relative: 5 % (ref 3–12)

## 2011-10-10 MED ORDER — AZITHROMYCIN 250 MG PO TABS: 250.0000 mg | ORAL_TABLET | Freq: Every day | ORAL | Status: AC

## 2011-10-10 MED ORDER — ALBUTEROL SULFATE HFA 108 (90 BASE) MCG/ACT IN AERS: 1.0000 | INHALATION_SPRAY | Freq: Four times a day (QID) | RESPIRATORY_TRACT | Status: DC | PRN

## 2011-10-10 MED ORDER — SODIUM CHLORIDE 0.9 % IV SOLN: INTRAVENOUS | Status: DC

## 2011-10-10 MED ORDER — SODIUM CHLORIDE 0.9 % IV BOLUS (SEPSIS)
500.0000 mL | Freq: Once | INTRAVENOUS | Status: AC
  Administered 2011-10-10: 500 mL via INTRAVENOUS

## 2011-10-10 MED ORDER — ACETAMINOPHEN 325 MG PO TABS
ORAL_TABLET | ORAL | Status: AC
  Filled 2011-10-10: qty 2

## 2011-10-10 MED ORDER — ACETAMINOPHEN 325 MG PO TABS
650.0000 mg | ORAL_TABLET | Freq: Once | ORAL | Status: AC
  Administered 2011-10-10: 650 mg via ORAL

## 2011-10-10 MED ORDER — DEXTROSE 5 % IV SOLN
1.0000 g | Freq: Once | INTRAVENOUS | Status: AC
  Administered 2011-10-10: 02:00:00 via INTRAVENOUS
  Filled 2011-10-10: qty 10

## 2011-10-10 MED ORDER — DEXTROSE 5 % IV SOLN
500.0000 mg | Freq: Once | INTRAVENOUS | Status: AC
  Administered 2011-10-10: 02:00:00 via INTRAVENOUS
  Filled 2011-10-10: qty 500

## 2011-10-10 NOTE — ED Provider Notes (Signed)
History     CSN: [id number]  Arrival date & time 10/09/11  2324   First MD Initiated Contact with Patient 10/10/11 0034      Chief Complaint  Patient presents with  . Emesis  . Diarrhea  . Cough  . Fever    (Consider location/radiation/quality/duration/timing/severity/associated sxs/prior treatment) HPI....cough and fever for 2 days. Also nausea vomiting and diarrhea. Passing urine.  Able to live independently. Nothing makes symptoms better or worse. No chest pain or shortness of breath. No radiation. Symptoms are moderate.  Past Medical History  Diagnosis Date  . Hypertension     History reviewed. No pertinent past surgical history.  History reviewed. No pertinent family history.  History  Substance Use Topics  . Smoking status: Never Smoker   . Smokeless tobacco: Not on file  . Alcohol Use: No    OB History    Grav Para Term Preterm Abortions TAB SAB Ect Mult Living                  Review of Systems  All other systems reviewed and are negative.    Allergies  Review of patient's allergies indicates no known allergies.  Home Medications   Current Outpatient Rx  Name Route Sig Dispense Refill  . AMLODIPINE BESYLATE 10 MG PO TABS Oral Take 10 mg by mouth daily.      Marland Kitchen HYDRALAZINE HCL 25 MG PO TABS Oral Take 25 mg by mouth daily.      . ALBUTEROL SULFATE HFA 108 (90 BASE) MCG/ACT IN AERS Inhalation Inhale 1-2 puffs into the lungs every 6 (six) hours as needed for wheezing. 1 Inhaler 0  . AZITHROMYCIN 250 MG PO TABS Oral Take 1 tablet (250 mg total) by mouth daily. Take first 2 tablets together, then 1 every day until finished. 6 tablet 0    BP 145/59  Pulse 71  Temp(Src) 102.7 F (39.3 C) (Oral)  Resp 18  Ht 5\' 5"  (1.651 m)  Wt 160 lb (72.576 kg)  BMI 26.63 kg/m2  SpO2 95%  Physical Exam  Nursing note and vitals reviewed. Constitutional: She is oriented to person, place, and time. She appears well-developed and well-nourished.       Patient is  alert, nontoxic.  HENT:  Head: Normocephalic and atraumatic.  Eyes: Conjunctivae and EOM are normal. Pupils are equal, round, and reactive to light.  Neck: Normal range of motion. Neck supple.  Cardiovascular: Normal rate and regular rhythm.   Pulmonary/Chest: Effort normal and breath sounds normal.  Abdominal: Soft. Bowel sounds are normal.  Musculoskeletal: Normal range of motion.  Neurological: She is alert and oriented to person, place, and time.  Skin: Skin is warm and dry.  Psychiatric: She has a normal mood and affect.    ED Course  Procedures (including critical care time)  Labs Reviewed  BASIC METABOLIC PANEL - Abnormal; Notable for the following:    Sodium 131 (*)    Glucose, Bld 151 (*)    GFR calc non Af Amer 58 (*)    GFR calc Af Amer 67 (*)    All other components within normal limits  CBC  DIFFERENTIAL  CULTURE, BLOOD (ROUTINE X 2)  CULTURE, BLOOD (ROUTINE X 2)   Dg Chest 2 View  10/10/2011  *RADIOLOGY REPORT*  Clinical Data: Short of breath.  Weakness.  CHEST - 2 VIEW  Comparison: 11/06/2008.  Findings: Dense left lower lobe opacification is present corresponding with the posterior basal and superior segment of the  left lower lobe on the lateral view.  This is in the retrocardiac region on the frontal view.  The right lung appears clear. Cardiopericardial silhouette appears within normal limits.  Trachea midline.  Bilateral AC joint osteoarthritis. Follow-up to ensure radiographic clearing recommended.  Clearing is usually observed at 4-6 weeks.  IMPRESSION: Left lower lobe pneumonia.  Original Report Authenticated By: Andreas Newport, M.D.     1. Community acquired pneumonia       MDM  Chest x-ray shows left lower lobe pneumonia. Will Rx IV Zithromax and Rocephin in the ED. Discharge home with Zithromax for 6 days.  He is able to be treated as an outpatient        Donnetta Hutching, MD 10/10/11 [phone]

## 2011-10-10 NOTE — ED Notes (Signed)
Normal saline infusion stopped. Discharge papers explained to patient and family. Verbalized understanding. Iv discontinued with no signs of infiltration. Bleeding controlled. cathlon intact. Getting dressed at this time. To be taken out to car by tech when dressed.

## 2011-10-10 NOTE — ED Notes (Signed)
Report given to this nurse. Assuming care of patient. Into room to see patient. Resting sitting up in bed. States she is starting to feel better. Temp rechecked. 99.8 oral. Denies any needs. No respiratory distress. Call bell within reach. Family with patient.

## 2011-10-11 MED ORDER — AZITHROMYCIN 250 MG PO TABS: 250.0000 mg | ORAL_TABLET | Freq: Every day | ORAL | Status: AC

## 2011-10-11 MED ORDER — ALBUTEROL SULFATE HFA 108 (90 BASE) MCG/ACT IN AERS: 1.0000 | INHALATION_SPRAY | Freq: Four times a day (QID) | RESPIRATORY_TRACT | Status: DC | PRN

## 2011-10-15 LAB — CULTURE, BLOOD (ROUTINE X 2): Culture: NO GROWTH

## 2012-09-30 ENCOUNTER — Other Ambulatory Visit (HOSPITAL_COMMUNITY): Payer: Self-pay | Admitting: Urology

## 2012-09-30 DIAGNOSIS — N390 Urinary tract infection, site not specified: Secondary | ICD-10-CM

## 2012-10-21 ENCOUNTER — Ambulatory Visit (HOSPITAL_COMMUNITY)
Admission: RE | Admit: 2012-10-21 | Discharge: 2012-10-21 | Disposition: A | Discharge status: Home / Self Care | Payer: Medicare Other | Source: Ambulatory Visit | Attending: Urology | Admitting: Urology

## 2012-10-21 DIAGNOSIS — N390 Urinary tract infection, site not specified: Secondary | ICD-10-CM

## 2012-10-21 DIAGNOSIS — Q619 Cystic kidney disease, unspecified: Secondary | ICD-10-CM | POA: Insufficient documentation

## 2013-05-06 ENCOUNTER — Encounter (HOSPITAL_COMMUNITY): Payer: Self-pay | Admitting: Pharmacy Technician

## 2013-05-07 ENCOUNTER — Encounter (HOSPITAL_COMMUNITY): Payer: Self-pay

## 2013-05-07 ENCOUNTER — Other Ambulatory Visit: Payer: Self-pay

## 2013-05-07 ENCOUNTER — Encounter (HOSPITAL_COMMUNITY)
Admission: RE | Admit: 2013-05-07 | Discharge: 2013-05-07 | Disposition: A | Discharge status: Home / Self Care | Payer: Medicare Other | Source: Ambulatory Visit | Attending: Ophthalmology | Admitting: Ophthalmology

## 2013-05-07 DIAGNOSIS — Z01810 Encounter for preprocedural cardiovascular examination: Secondary | ICD-10-CM | POA: Insufficient documentation

## 2013-05-07 DIAGNOSIS — Z01812 Encounter for preprocedural laboratory examination: Secondary | ICD-10-CM | POA: Insufficient documentation

## 2013-05-07 HISTORY — DX: Depression, unspecified: F32.A

## 2013-05-07 HISTORY — DX: Malignant (primary) neoplasm, unspecified: C80.1

## 2013-05-07 HISTORY — DX: Unspecified asthma, uncomplicated: J45.909

## 2013-05-07 HISTORY — DX: Major depressive disorder, single episode, unspecified: F32.9

## 2013-05-07 HISTORY — DX: Unspecified osteoarthritis, unspecified site: M19.90

## 2013-05-07 HISTORY — DX: Anxiety disorder, unspecified: F41.9

## 2013-05-07 HISTORY — DX: Gastro-esophageal reflux disease without esophagitis: K21.9

## 2013-05-07 LAB — BASIC METABOLIC PANEL
BUN: 16 mg/dL (ref 6–23)
Calcium: 10.6 mg/dL — ABNORMAL HIGH (ref 8.4–10.5)
GFR calc non Af Amer: 47 mL/min — ABNORMAL LOW (ref >90)
Glucose, Bld: 104 mg/dL — ABNORMAL HIGH (ref 70–99)
Sodium: 138 mEq/L (ref 135–145)

## 2013-05-07 NOTE — Patient Instructions (Addendum)
Your procedure is scheduled on: 05/12/2013  Report to [facility] at  700  AM.  Call this number if you have problems the morning of surgery: [phone]   Do not eat food or drink liquids :After Midnight.      Take these medicines the morning of surgery with A SIP OF WATER: norco, norvasc, apresoline, hctz, prilosec. Take your singulair before you come.   Do not wear jewelry, make-up or nail polish.  Do not wear lotions, powders, or perfumes.   Do not shave 48 hours prior to surgery.  Do not bring valuables to the hospital.  Contacts, dentures or bridgework may not be worn into surgery.  Leave suitcase in the car. After surgery it may be brought to your room.  For patients admitted to the hospital, checkout time is 11:00 AM the day of discharge.   Patients discharged the day of surgery will not be allowed to drive home.  :     Please read over the following fact sheets that you were given: Coughing and Deep Breathing, Surgical Site Infection Prevention, Anesthesia Post-op Instructions and Care and Recovery After Surgery    Cataract A cataract is a clouding of the lens of the eye. When a lens becomes cloudy, vision is reduced based on the degree and nature of the clouding. Many cataracts reduce vision to some degree. Some cataracts make people more near-sighted as they develop. Other cataracts increase glare. Cataracts that are ignored and become worse can sometimes look white. The white color can be seen through the pupil. CAUSES   Aging. However, cataracts may occur at any age, even in newborns.   Certain drugs.   Trauma to the eye.   Certain diseases such as diabetes.   Specific eye diseases such as chronic inflammation inside the eye or a sudden attack of a rare form of glaucoma.   Inherited or acquired medical problems.  SYMPTOMS   Gradual, progressive drop in vision in the affected eye.   Severe, rapid visual loss. This most often happens when trauma is the cause.    DIAGNOSIS  To detect a cataract, an eye doctor examines the lens. Cataracts are best diagnosed with an exam of the eyes with the pupils enlarged (dilated) by drops.  TREATMENT  For an early cataract, vision may improve by using different eyeglasses or stronger lighting. If that does not help your vision, surgery is the only effective treatment. A cataract needs to be surgically removed when vision loss interferes with your everyday activities, such as driving, reading, or watching TV. A cataract may also have to be removed if it prevents examination or treatment of another eye problem. Surgery removes the cloudy lens and usually replaces it with a substitute lens (intraocular lens, IOL).  At a time when both you and your doctor agree, the cataract will be surgically removed. If you have cataracts in both eyes, only one is usually removed at a time. This allows the operated eye to heal and be out of danger from any possible problems after surgery (such as infection or poor wound healing). In rare cases, a cataract may be doing damage to your eye. In these cases, your caregiver may advise surgical removal right away. The vast majority of people who have cataract surgery have better vision afterward. HOME CARE INSTRUCTIONS  If you are not planning surgery, you may be asked to do the following:  Use different eyeglasses.   Use stronger or brighter lighting.   Ask your  eye doctor about reducing your medicine dose or changing medicines if it is thought that a medicine caused your cataract. Changing medicines does not make the cataract go away on its own.   Become familiar with your surroundings. Poor vision can lead to injury. Avoid bumping into things on the affected side. You are at a higher risk for tripping or falling.   Exercise extreme care when driving or operating machinery.   Wear sunglasses if you are sensitive to bright light or experiencing problems with glare.  SEEK IMMEDIATE MEDICAL CARE  IF:   You have a worsening or sudden vision loss.   You notice redness, swelling, or increasing pain in the eye.   You have a fever.  Document Released: 10/02/2005 Document Revised: 09/21/2011 Document Reviewed: 05/26/2011 [facility] Patient Information 2012 [employer].PATIENT INSTRUCTIONS POST-ANESTHESIA  IMMEDIATELY FOLLOWING SURGERY:  Do not drive or operate machinery for the first twenty four hours after surgery.  Do not make any important decisions for twenty four hours after surgery or while taking narcotic pain medications or sedatives.  If you develop intractable nausea and vomiting or a severe headache please notify your doctor immediately.  FOLLOW-UP:  Please make an appointment with your surgeon as instructed. You do not need to follow up with anesthesia unless specifically instructed to do so.  WOUND CARE INSTRUCTIONS (if applicable):  Keep a dry clean dressing on the anesthesia/puncture wound site if there is drainage.  Once the wound has quit draining you may leave it open to air.  Generally you should leave the bandage intact for twenty four hours unless there is drainage.  If the epidural site drains for more than 36-48 hours please call the anesthesia department.  QUESTIONS?:  Please feel free to call your physician or the hospital operator if you have any questions, and they will be happy to assist you.

## 2013-05-08 MED ORDER — FENTANYL CITRATE 0.05 MG/ML IJ SOLN
INTRAMUSCULAR | Status: AC
  Filled 2013-05-08: qty 2

## 2013-05-08 MED ORDER — ONDANSETRON HCL 4 MG/2ML IJ SOLN
INTRAMUSCULAR | Status: AC
  Filled 2013-05-08: qty 2

## 2013-05-08 MED ORDER — MIDAZOLAM HCL 2 MG/2ML IJ SOLN
INTRAMUSCULAR | Status: AC
  Filled 2013-05-08: qty 2

## 2013-05-09 MED ORDER — PHENYLEPHRINE HCL 2.5 % OP SOLN
OPHTHALMIC | Status: AC
  Filled 2013-05-09: qty 15

## 2013-05-09 MED ORDER — TETRACAINE HCL 0.5 % OP SOLN
OPHTHALMIC | Status: AC
  Filled 2013-05-09: qty 2

## 2013-05-09 MED ORDER — LIDOCAINE HCL 3.5 % OP GEL
OPHTHALMIC | Status: AC
  Filled 2013-05-09: qty 5

## 2013-05-09 MED ORDER — CYCLOPENTOLATE-PHENYLEPHRINE 0.2-1 % OP SOLN
OPHTHALMIC | Status: AC
  Filled 2013-05-09: qty 2

## 2013-05-09 MED ORDER — NEOMYCIN-POLYMYXIN-DEXAMETH 3.5-10000-0.1 OP OINT
TOPICAL_OINTMENT | OPHTHALMIC | Status: AC
  Filled 2013-05-09: qty 3.5

## 2013-05-12 ENCOUNTER — Encounter (HOSPITAL_COMMUNITY): Payer: Self-pay | Admitting: *Deleted

## 2013-05-12 ENCOUNTER — Ambulatory Visit (HOSPITAL_COMMUNITY): Payer: Medicare Other | Admitting: Anesthesiology

## 2013-05-12 ENCOUNTER — Encounter (HOSPITAL_COMMUNITY): Payer: Self-pay | Admitting: Anesthesiology

## 2013-05-12 ENCOUNTER — Encounter (HOSPITAL_COMMUNITY)
Admission: RE | Disposition: A | Discharge status: Home / Self Care | Payer: Self-pay | Source: Ambulatory Visit | Attending: Ophthalmology

## 2013-05-12 ENCOUNTER — Ambulatory Visit (HOSPITAL_COMMUNITY)
Admission: RE | Admit: 2013-05-12 | Discharge: 2013-05-12 | Disposition: A | Discharge status: Home / Self Care | Payer: Medicare Other | Source: Ambulatory Visit | Attending: Ophthalmology | Admitting: Ophthalmology

## 2013-05-12 DIAGNOSIS — I10 Essential (primary) hypertension: Secondary | ICD-10-CM | POA: Insufficient documentation

## 2013-05-12 DIAGNOSIS — H2589 Other age-related cataract: Secondary | ICD-10-CM | POA: Insufficient documentation

## 2013-05-12 HISTORY — PX: CATARACT EXTRACTION W/PHACO: SHX586

## 2013-05-12 SURGERY — PHACOEMULSIFICATION, CATARACT, WITH IOL INSERTION
Anesthesia: Monitor Anesthesia Care | Site: Eye | Laterality: Right | Wound class: Clean

## 2013-05-12 MED ORDER — BSS IO SOLN
INTRAOCULAR | Status: DC | PRN
  Administered 2013-05-12: 15 mL via INTRAOCULAR

## 2013-05-12 MED ORDER — LIDOCAINE HCL 3.5 % OP GEL
1.0000 "application " | Freq: Once | OPHTHALMIC | Status: AC
  Administered 2013-05-12: 1 via OPHTHALMIC

## 2013-05-12 MED ORDER — LIDOCAINE HCL (PF) 1 % IJ SOLN
INTRAMUSCULAR | Status: DC | PRN
  Administered 2013-05-12: .4 mL

## 2013-05-12 MED ORDER — PHENYLEPHRINE HCL 2.5 % OP SOLN
1.0000 [drp] | OPHTHALMIC | Status: AC
  Administered 2013-05-12 (×3): 1 [drp] via OPHTHALMIC

## 2013-05-12 MED ORDER — CYCLOPENTOLATE-PHENYLEPHRINE 0.2-1 % OP SOLN
1.0000 [drp] | OPHTHALMIC | Status: AC
  Administered 2013-05-12 (×3): 1 [drp] via OPHTHALMIC

## 2013-05-12 MED ORDER — NEOMYCIN-POLYMYXIN-DEXAMETH 0.1 % OP OINT
TOPICAL_OINTMENT | OPHTHALMIC | Status: DC | PRN
  Administered 2013-05-12: 1 via OPHTHALMIC

## 2013-05-12 MED ORDER — EPINEPHRINE HCL 1 MG/ML IJ SOLN
INTRAOCULAR | Status: DC | PRN
  Administered 2013-05-12: 09:00:00

## 2013-05-12 MED ORDER — LACTATED RINGERS IV SOLN
INTRAVENOUS | Status: DC | PRN
  Administered 2013-05-12: 08:00:00 via INTRAVENOUS

## 2013-05-12 MED ORDER — POVIDONE-IODINE 5 % OP SOLN
OPHTHALMIC | Status: DC | PRN
  Administered 2013-05-12: 1 via OPHTHALMIC

## 2013-05-12 MED ORDER — LACTATED RINGERS IV SOLN
INTRAVENOUS | Status: DC
  Administered 2013-05-12: 1000 mL via INTRAVENOUS

## 2013-05-12 MED ORDER — PROVISC 10 MG/ML IO SOLN
INTRAOCULAR | Status: DC | PRN
  Administered 2013-05-12: 8.5 mg via INTRAOCULAR

## 2013-05-12 MED ORDER — TETRACAINE HCL 0.5 % OP SOLN
1.0000 [drp] | OPHTHALMIC | Status: AC
  Administered 2013-05-12 (×3): 1 [drp] via OPHTHALMIC

## 2013-05-12 MED ORDER — MIDAZOLAM HCL 2 MG/2ML IJ SOLN
1.0000 mg | INTRAMUSCULAR | Status: DC | PRN
  Administered 2013-05-12: 2 mg via INTRAVENOUS

## 2013-05-12 MED ORDER — MIDAZOLAM HCL 2 MG/2ML IJ SOLN
INTRAMUSCULAR | Status: AC
  Filled 2013-05-12: qty 2

## 2013-05-12 SURGICAL SUPPLY — 32 items

## 2013-05-12 NOTE — Anesthesia Postprocedure Evaluation (Signed)
  Anesthesia Post-op Note  Patient: [name] Goodman  Procedure(s) Performed: Procedure(s) with comments: CATARACT EXTRACTION PHACO AND INTRAOCULAR LENS PLACEMENT (IOC) (Right) - CDE 25.23  Patient Location: Short Stay  Anesthesia Type:MAC  Level of Consciousness: awake, alert , oriented and patient cooperative  Airway and Oxygen Therapy: Patient Spontanous Breathing  Post-op Pain: none  Post-op Assessment: Post-op Vital signs reviewed, Patient's Cardiovascular Status Stable, Respiratory Function Stable, Patent Airway, No signs of Nausea or vomiting and Pain level controlled  Post-op Vital Signs: Reviewed and stable  Complications: No apparent anesthesia complications

## 2013-05-12 NOTE — Op Note (Signed)
Date of Admission: 05/12/2013  Date of Surgery: 05/12/2013  Pre-Op Dx: Cataract  Right  Eye  Post-Op Dx: Cataract  Right  Eye,  Dx Code 366.19  Surgeon: Gemma Payor, M.D.  Assistants: None  Anesthesia: Topical with MAC  Indications: Painless, progressive loss of vision with compromise of daily activities.  Surgery: Cataract Extraction with Intraocular lens Implant Right Eye  Discription: The patient had dilating drops and viscous lidocaine placed into the left eye in the pre-op holding area. After transfer to the operating room, a time out was performed. The patient was then prepped and draped. Beginning with a 75 degree blade a paracentesis port was made at the surgeon's 2 o'clock position. The anterior chamber was then filled with 1% non-preserved lidocaine. This was followed by filling the anterior chamber with Provisc. A bent cystatome needle was used to create a continuous tear capsulotomy. Hydrodissection was performed with balanced salt solution on a Fine canula. The lens nucleus was then removed using the phacoemulsification handpiece. Residual cortex was removed with the I&A handpiece. The anterior chamber and capsular bag were refilled with Provisc. A posterior chamber intraocular lens was placed into the capsular bag with it's injector. The implant was positioned with the Kuglan hook. The Provisc was then removed from the anterior chamber and capsular bag with the I&A handpiece. Stromal hydration of the main incision and paracentesis port was performed with BSS on a Fine canula. The wounds were tested for leak which was negative. The patient tolerated the procedure well. There were no operative complications. The patient was then transferred to the recovery room in stable condition.  Complications: None  Specimen: None  EBL: None  Prosthetic device: B&L enVista, MX60, power 25.0D, SN [id number].

## 2013-05-12 NOTE — H&P (Signed)
I have reviewed the H&P, the patient was re-examined, and I have identified no interval changes in medical condition and plan of care since the history and physical of record  

## 2013-05-12 NOTE — Preoperative (Signed)
Beta Blockers   Reason not to administer Beta Blockers:Not Applicable 

## 2013-05-12 NOTE — Anesthesia Procedure Notes (Signed)
Procedure Name: MAC Date/Time: 05/12/2013 8:37 AM Performed by: Carolyne Littles, AMY L Pre-anesthesia Checklist: Patient identified, Timeout performed, Emergency Drugs available, Suction available and Patient being monitored Oxygen Delivery Method: Nasal cannula

## 2013-05-12 NOTE — Anesthesia Preprocedure Evaluation (Signed)
Anesthesia Evaluation  Patient identified by MRN, date of birth, ID band Patient awake    Reviewed: Allergy & Precautions, H&P , NPO status , Patient's Chart, lab work & pertinent test results  Airway Mallampati: II      Dental  (+) Edentulous Upper and Edentulous Lower   Pulmonary asthma ,  breath sounds clear to auscultation        Cardiovascular hypertension, Pt. on medications Rhythm:Regular Rate:Normal     Neuro/Psych PSYCHIATRIC DISORDERS Anxiety Depression    GI/Hepatic GERD-  Medicated,  Endo/Other    Renal/GU      Musculoskeletal   Abdominal   Peds  Hematology   Anesthesia Other Findings   Reproductive/Obstetrics                           Anesthesia Physical Anesthesia Plan  ASA: III  Anesthesia Plan: MAC   Post-op Pain Management:    Induction: Intravenous  Airway Management Planned: Nasal Cannula  Additional Equipment:   Intra-op Plan:   Post-operative Plan:   Informed Consent: I have reviewed the patients History and Physical, chart, labs and discussed the procedure including the risks, benefits and alternatives for the proposed anesthesia with the patient or authorized representative who has indicated his/her understanding and acceptance.     Plan Discussed with:   Anesthesia Plan Comments:         Anesthesia Quick Evaluation

## 2013-05-12 NOTE — Transfer of Care (Signed)
Immediate Anesthesia Transfer of Care Note  Patient: [name] Goodman  Procedure(s) Performed: Procedure(s) with comments: CATARACT EXTRACTION PHACO AND INTRAOCULAR LENS PLACEMENT (IOC) (Right) - CDE 25.23  Patient Location: Short Stay  Anesthesia Type:MAC  Level of Consciousness: awake, alert , oriented and patient cooperative  Airway & Oxygen Therapy: Patient Spontanous Breathing  Post-op Assessment: Report given to PACU RN and Post -op Vital signs reviewed and stable  Post vital signs: Reviewed and stable  Complications: No apparent anesthesia complications

## 2013-05-13 ENCOUNTER — Encounter (HOSPITAL_COMMUNITY): Payer: Self-pay | Admitting: Ophthalmology

## 2013-08-04 ENCOUNTER — Encounter: Payer: Self-pay | Admitting: Gastroenterology

## 2013-08-21 ENCOUNTER — Encounter (INDEPENDENT_AMBULATORY_CARE_PROVIDER_SITE_OTHER): Payer: Self-pay

## 2013-08-21 ENCOUNTER — Encounter: Payer: Self-pay | Admitting: Gastroenterology

## 2013-08-21 ENCOUNTER — Ambulatory Visit (INDEPENDENT_AMBULATORY_CARE_PROVIDER_SITE_OTHER): Payer: Medicare Other | Admitting: Gastroenterology

## 2013-08-21 VITALS — BP 152/72 | HR 62 | Temp 97.8°F | Wt 159.4 lb

## 2013-08-21 DIAGNOSIS — C20 Malignant neoplasm of rectum: Secondary | ICD-10-CM

## 2013-08-21 NOTE — Patient Instructions (Signed)
1. Colonoscopy as planned.  See separate instructions. 

## 2013-08-21 NOTE — Progress Notes (Signed)
Primary Care Physician:  Colon Branch, MD  Primary Gastroenterologist:  Jonette Eva, MD   Chief Complaint  Patient presents with  . Colonoscopy    HPI:  [name] Goodman is a 79 y.o. female here to schedule surveillance colonoscopy. She has a history of stage I rectal cancer diagnosed in the summer 2009. Initial colonoscopy was by Dr. Dionicia Abler. She had a 3 cm wide-based polyp at the distal rectum. Removed piecemeal. 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 Due to the requirement of having further workup done in network her care was transferred to Dr. fields. She had transanal excision of rectal cancer 11/2008. She did not require any further treatment. Patient ended up back in the hospital in 02/2009 and she underwent colonoscopy as detailed below.   Takes capful of MOM every day for constipation. No melena, brbpr. No abdominal pain. No heartburn, vomiting. 10 pound weight loss over the last three years. Snacks frequently. Daughter doesn't think she eats enough.    Current Outpatient Prescriptions  Medication Sig Dispense Refill  . amLODipine (NORVASC) 10 MG tablet Take 10 mg by mouth daily.        Marland Kitchen aspirin EC 81 MG tablet Take 81 mg by mouth every other day.       . fish oil-omega-3 fatty acids 1000 MG capsule Take 1 g by mouth daily.      Marland Kitchen glucosamine-chondroitin 500-400 MG tablet Take 1 tablet by mouth 2 (two) times daily.      . hydrALAZINE (APRESOLINE) 25 MG tablet Take 25 mg by mouth daily.        . hydrochlorothiazide (HYDRODIURIL) 25 MG tablet Take 25 mg by mouth daily.      Marland Kitchen HYDROcodone-acetaminophen (NORCO/VICODIN) 5-325 MG per tablet Take 1 tablet by mouth every 6 (six) hours as needed for pain.      . montelukast (SINGULAIR) 10 MG tablet Take 10 mg by mouth at bedtime.      Marland Kitchen omeprazole (PRILOSEC) 20 MG capsule Take 20 mg by mouth daily.       No current facility-administered medications for this visit.    Allergies as of 08/21/2013   . (No Known Allergies)    Past Medical History  Diagnosis Date  . Hypertension   . GERD (gastroesophageal reflux disease)   . Anxiety   . Depression   . Asthma   . Cancer 2010    Dr Lovell Sheehan, Stage I rectal cancer  . Arthritis     Past Surgical History  Procedure Laterality Date  . Colon resection  2010    rectal cancer  . Cataract extraction Left   . Cataract extraction w/phaco Right 05/12/2013    Procedure: CATARACT EXTRACTION PHACO AND INTRAOCULAR LENS PLACEMENT (IOC);  Surgeon: Gemma Payor, MD;  Location: [facility];  Service: Ophthalmology;  Laterality: Right;  CDE 25.23  . Ileocolonoscopy  03/08/2009    VHQ:IONG surgical scar present in rectal mucosa with polypoid lesion,overlying it as described above, status post a clean resection/Pan colonic diverticula, remainder of colonic mucosa appeared  . Esophagogastroduodenoscopy  03/08/2009    EXB:MWUXLK esophagus, small hiatal hernia, otherwise normal    Family History  Problem Relation Age of Onset  . Colon cancer Neg Hx     History   Social History  . Marital Status: Widowed    Spouse Name: N/A    Number of Children: 4  . Years of Education: N/A   Occupational History  . Not on file.   Social  History Main Topics  . Smoking status: Never Smoker   . Smokeless tobacco: Not on file  . Alcohol Use: No  . Drug Use: No  . Sexual Activity: Yes    Birth Control/ Protection: Post-menopausal   Other Topics Concern  . Not on file   Social History Narrative  . No narrative on file      ROS:  General: Negative for anorexia, weight loss, fever, chills, fatigue, weakness. Eyes: Negative for vision changes.  ENT: Negative for hoarseness, difficulty swallowing , nasal congestion. CV: Negative for chest pain, angina, palpitations, dyspnea on exertion, peripheral edema.  Respiratory: Negative for dyspnea at rest, dyspnea on exertion, cough, sputum, wheezing.  GI: See history of present illness. GU:  Negative for dysuria,  hematuria, urinary incontinence, urinary frequency, nocturnal urination.  MS: Negative for joint pain, low back pain.  Derm: Negative for rash or itching.  Neuro: Negative for weakness, abnormal sensation, seizure, frequent headaches, memory loss, confusion.  Psych: Negative for anxiety, depression, suicidal ideation, hallucinations.  Endo: Negative for unusual weight change.  Heme: Negative for bruising or bleeding. Allergy: Negative for rash or hives.    Physical Examination:  BP 152/72  Pulse 62  Temp(Src) 97.8 F (36.6 C) (Oral)  Wt 159 lb 6.4 oz (72.303 kg)   General: Well-nourished, well-developed in no acute distress.  Head: Normocephalic, atraumatic.   Eyes: Conjunctiva pink, no icterus. Mouth: Oropharyngeal mucosa moist and pink , no lesions erythema or exudate. Neck: Supple without thyromegaly, masses, or lymphadenopathy.  Lungs: Clear to auscultation bilaterally.  Heart: Regular rate and rhythm, no murmurs rubs or gallops.  Abdomen: Bowel sounds are normal, nontender, nondistended, no hepatosplenomegaly or masses, no abdominal bruits or    hernia , no rebound or guarding.   Rectal: not performed Extremities: No lower extremity edema. No clubbing or deformities.  Neuro: Alert and oriented x 4 , grossly normal neurologically.  Skin: Warm and dry, no rash or jaundice.   Psych: Alert and cooperative, normal mood and affect.

## 2013-08-22 ENCOUNTER — Encounter: Payer: Self-pay | Admitting: Gastroenterology

## 2013-08-22 NOTE — Assessment & Plan Note (Signed)
Overdue for surveillance colonoscopy. Patient requests Dr. Darrick Penna, who she initially saw after transferring care from Dr. Karilyn Cota back in 2009.  I have discussed the risks, alternatives, benefits with regards to but not limited to the risk of reaction to medication, bleeding, infection, perforation and the patient is agreeable to proceed. Written consent to be obtained.

## 2013-08-25 ENCOUNTER — Other Ambulatory Visit: Payer: Self-pay | Admitting: Gastroenterology

## 2013-08-25 DIAGNOSIS — Z85048 Personal history of other malignant neoplasm of rectum, rectosigmoid junction, and anus: Secondary | ICD-10-CM

## 2013-08-25 MED ORDER — PEG 3350-KCL-NA BICARB-NACL 420 G PO SOLR: 4000.0000 mL | ORAL | Status: DC

## 2013-08-25 NOTE — Progress Notes (Signed)
cc'd to pcp 

## 2013-09-02 ENCOUNTER — Encounter (HOSPITAL_COMMUNITY): Payer: Self-pay

## 2013-09-16 ENCOUNTER — Encounter (HOSPITAL_COMMUNITY)
Admission: RE | Disposition: A | Discharge status: Home / Self Care | Payer: Self-pay | Source: Ambulatory Visit | Attending: Gastroenterology

## 2013-09-16 ENCOUNTER — Ambulatory Visit (HOSPITAL_COMMUNITY)
Admission: RE | Admit: 2013-09-16 | Discharge: 2013-09-16 | Disposition: A | Discharge status: Home / Self Care | Payer: Medicare Other | Source: Ambulatory Visit | Attending: Gastroenterology | Admitting: Gastroenterology

## 2013-09-16 ENCOUNTER — Encounter (HOSPITAL_COMMUNITY): Payer: Self-pay | Admitting: *Deleted

## 2013-09-16 DIAGNOSIS — K5730 Diverticulosis of large intestine without perforation or abscess without bleeding: Secondary | ICD-10-CM

## 2013-09-16 DIAGNOSIS — Z85048 Personal history of other malignant neoplasm of rectum, rectosigmoid junction, and anus: Secondary | ICD-10-CM

## 2013-09-16 DIAGNOSIS — I10 Essential (primary) hypertension: Secondary | ICD-10-CM | POA: Insufficient documentation

## 2013-09-16 DIAGNOSIS — Z9889 Other specified postprocedural states: Secondary | ICD-10-CM | POA: Insufficient documentation

## 2013-09-16 DIAGNOSIS — K648 Other hemorrhoids: Secondary | ICD-10-CM | POA: Insufficient documentation

## 2013-09-16 DIAGNOSIS — Z1211 Encounter for screening for malignant neoplasm of colon: Secondary | ICD-10-CM

## 2013-09-16 HISTORY — PX: COLONOSCOPY: SHX5424

## 2013-09-16 SURGERY — COLONOSCOPY
Anesthesia: Moderate Sedation

## 2013-09-16 MED ORDER — MIDAZOLAM HCL 5 MG/5ML IJ SOLN
INTRAMUSCULAR | Status: AC
  Filled 2013-09-16: qty 10

## 2013-09-16 MED ORDER — STERILE WATER FOR IRRIGATION IR SOLN
Status: DC | PRN
  Administered 2013-09-16: 10:00:00

## 2013-09-16 MED ORDER — SODIUM CHLORIDE 0.9 % IV SOLN
INTRAVENOUS | Status: DC
  Administered 2013-09-16: 09:00:00 via INTRAVENOUS

## 2013-09-16 MED ORDER — MIDAZOLAM HCL 5 MG/5ML IJ SOLN
INTRAMUSCULAR | Status: DC | PRN
  Administered 2013-09-16: 2 mg via INTRAVENOUS
  Administered 2013-09-16 (×2): 1 mg via INTRAVENOUS

## 2013-09-16 MED ORDER — MEPERIDINE HCL 100 MG/ML IJ SOLN
INTRAMUSCULAR | Status: DC | PRN
  Administered 2013-09-16 (×2): 25 mg via INTRAVENOUS

## 2013-09-16 MED ORDER — MEPERIDINE HCL 100 MG/ML IJ SOLN
INTRAMUSCULAR | Status: AC
  Filled 2013-09-16: qty 2

## 2013-09-16 NOTE — Progress Notes (Signed)
REVIEWED.  

## 2013-09-16 NOTE — H&P (Signed)
Primary Care Physician:  Colon Branch, MD Primary Gastroenterologist:  Dr. Darrick Penna  Pre-Procedure History & Physical: HPI:  [name] Goodman is a 79 y.o. female here for PERSONAL Hx COLON CA.  Past Medical History  Diagnosis Date  . Hypertension   . GERD (gastroesophageal reflux disease)   . Anxiety   . Depression   . Asthma   . Cancer 2010    Dr Lovell Sheehan, Stage I rectal cancer  . Arthritis     Past Surgical History  Procedure Laterality Date  . Colon resection  2010    rectal cancer  . Cataract extraction Left   . Cataract extraction w/phaco Right 05/12/2013    Procedure: CATARACT EXTRACTION PHACO AND INTRAOCULAR LENS PLACEMENT (IOC);  Surgeon: Gemma Payor, MD;  Location: [facility];  Service: Ophthalmology;  Laterality: Right;  CDE 25.23  . Ileocolonoscopy  03/08/2009    AVW:UJWJ surgical scar present in rectal mucosa with polypoid lesion,overlying it as described above, status post a clean resection/Pan colonic diverticula, remainder of colonic mucosa appeared  . Esophagogastroduodenoscopy  03/08/2009    XBJ:YNWGNF esophagus, small hiatal hernia, otherwise normal    Prior to Admission medications   Medication Sig Start Date End Date Taking? Authorizing Provider  amLODipine (NORVASC) 10 MG tablet Take 10 mg by mouth daily.     Yes Historical Provider, MD  fish oil-omega-3 fatty acids 1000 MG capsule Take 1 g by mouth daily.   Yes Historical Provider, MD  glucosamine-chondroitin 500-400 MG tablet Take 1 tablet by mouth 2 (two) times daily.   Yes Historical Provider, MD  hydrALAZINE (APRESOLINE) 25 MG tablet Take 25 mg by mouth daily.     Yes Historical Provider, MD  hydrochlorothiazide (HYDRODIURIL) 25 MG tablet Take 25 mg by mouth daily.   Yes Historical Provider, MD  montelukast (SINGULAIR) 10 MG tablet Take 10 mg by mouth at bedtime.   Yes Historical Provider, MD  omeprazole (PRILOSEC) 20 MG capsule Take 20 mg by mouth daily.   Yes Historical Provider, MD    Allergies as of  08/25/2013  . (No Known Allergies)    Family History  Problem Relation Age of Onset  . Colon cancer Neg Hx     History   Social History  . Marital Status: Widowed    Spouse Name: N/A    Number of Children: 4  . Years of Education: N/A   Occupational History  . Not on file.   Social History Main Topics  . Smoking status: Never Smoker   . Smokeless tobacco: Not on file  . Alcohol Use: No  . Drug Use: No  . Sexual Activity: Yes    Birth Control/ Protection: Post-menopausal   Other Topics Concern  . Not on file   Social History Narrative  . No narrative on file    Review of Systems: See HPI, otherwise negative ROS   Physical Exam: BP 166/68  Pulse 65  Temp(Src) 97.6 F (36.4 C) (Oral)  Resp 18  Ht 5\' 5"  (1.651 m)  Wt 159 lb (72.122 kg)  BMI 26.46 kg/m2  SpO2 99% General:   Alert,  pleasant and cooperative in NAD Head:  Normocephalic and atraumatic. Neck:  Supple; Lungs:  Clear throughout to auscultation.    Heart:  Regular rate and rhythm. Abdomen:  Soft, nontender and nondistended. Normal bowel sounds, without guarding, and without rebound.   Neurologic:  Alert and  oriented x4;  grossly normal neurologically.  Impression/Plan:   PERSONAL Hx COLON CA  Plan:  1. TCS TODAY

## 2013-09-16 NOTE — Op Note (Signed)
[facility] [address] [place], [address]   COLONOSCOPY PROCEDURE REPORT  PATIENT: [name], Goodman  MR#: [id number] BIRTHDATE: 1931/12/04 , 81  yrs. old GENDER: Female ENDOSCOPIST: Jonette Eva, MD REFERRED XB:JYNWG Qureshi, M.D. PROCEDURE DATE:  09/16/2013 PROCEDURE:   Colonoscopy, screening INDICATIONS:High risk patient with personal history of rectal cancer. MEDICATIONS: Demerol 50 mg IV and Versed 4 mg IV  DESCRIPTION OF PROCEDURE:    Physical exam was performed.  Informed consent was obtained from the patient after explaining the benefits, risks, and alternatives to procedure.  The patient was connected to monitor and placed in left lateral position. Continuous oxygen was provided by nasal cannula and IV medicine administered through an indwelling cannula.  After administration of sedation and rectal exam, the patients rectum was intubated and the EC-[id number] ([id number])  colonoscope was advanced under direct visualization to the ileum.  The scope was removed slowly by carefully examining the color, texture, anatomy, and integrity mucosa on the way out.  The patient was recovered in endoscopy and discharged home in satisfactory condition.    COLON FINDINGS: There was moderate diverticulosis noted throughout the entire examined colon with associated muscular hypertrophy. The colon mucosa was otherwise normal. NO POLYPS. Small internal hemorrhoids were found.   NORMAL SCAR AT THE POST-SURGICAL SITE IN THE RECTUM.  PREP QUALITY: excellent.  CECAL W/D TIME: 8 minutes COMPLICATIONS: None  ENDOSCOPIC IMPRESSION: 1.   Moderate diverticulosis throughout the entire examined colon 2.   NORMAL SCAR AT THE POST-SURGICAL SITE IN THE RECTUM 3.   Small internal hemorrhoids  RECOMMENDATIONS: DRINK WATER TO KEEP YOUR URINE LIGHT YELLOW. Follow a HIGH FIBER DIET.  AVOID ITEMS THAT CAUSE BLOATING.  Next colonoscopy in 5  years.       _______________________________ [name] DoctorJonette Eva, MD 09/16/2013 11:13 AM

## 2013-09-19 ENCOUNTER — Encounter (HOSPITAL_COMMUNITY): Payer: Self-pay | Admitting: Gastroenterology

## 2014-02-09 ENCOUNTER — Encounter (HOSPITAL_COMMUNITY): Payer: Self-pay | Admitting: Emergency Medicine

## 2014-02-09 ENCOUNTER — Emergency Department (HOSPITAL_COMMUNITY)
Admission: EM | Admit: 2014-02-09 | Discharge: 2014-02-09 | Disposition: A | Discharge status: Home / Self Care | Payer: Medicare HMO | Attending: Emergency Medicine | Admitting: Emergency Medicine

## 2014-02-09 DIAGNOSIS — L03319 Cellulitis of trunk, unspecified: Principal | ICD-10-CM

## 2014-02-09 DIAGNOSIS — L0291 Cutaneous abscess, unspecified: Secondary | ICD-10-CM

## 2014-02-09 DIAGNOSIS — Z85048 Personal history of other malignant neoplasm of rectum, rectosigmoid junction, and anus: Secondary | ICD-10-CM | POA: Insufficient documentation

## 2014-02-09 DIAGNOSIS — I10 Essential (primary) hypertension: Secondary | ICD-10-CM | POA: Insufficient documentation

## 2014-02-09 DIAGNOSIS — K219 Gastro-esophageal reflux disease without esophagitis: Secondary | ICD-10-CM | POA: Insufficient documentation

## 2014-02-09 DIAGNOSIS — Z8739 Personal history of other diseases of the musculoskeletal system and connective tissue: Secondary | ICD-10-CM | POA: Insufficient documentation

## 2014-02-09 DIAGNOSIS — Z8659 Personal history of other mental and behavioral disorders: Secondary | ICD-10-CM | POA: Insufficient documentation

## 2014-02-09 DIAGNOSIS — L02219 Cutaneous abscess of trunk, unspecified: Secondary | ICD-10-CM | POA: Insufficient documentation

## 2014-02-09 DIAGNOSIS — Z85038 Personal history of other malignant neoplasm of large intestine: Secondary | ICD-10-CM | POA: Insufficient documentation

## 2014-02-09 DIAGNOSIS — J45909 Unspecified asthma, uncomplicated: Secondary | ICD-10-CM | POA: Insufficient documentation

## 2014-02-09 DIAGNOSIS — Z79899 Other long term (current) drug therapy: Secondary | ICD-10-CM | POA: Insufficient documentation

## 2014-02-09 DIAGNOSIS — Z9889 Other specified postprocedural states: Secondary | ICD-10-CM | POA: Insufficient documentation

## 2014-02-09 MED ORDER — CEPHALEXIN 500 MG PO CAPS: 500.0000 mg | ORAL_CAPSULE | Freq: Four times a day (QID) | ORAL | Status: DC

## 2014-02-09 MED ORDER — SULFAMETHOXAZOLE-TMP DS 800-160 MG PO TABS: 1.0000 | ORAL_TABLET | Freq: Two times a day (BID) | ORAL | Status: DC

## 2014-02-09 MED ORDER — MUPIROCIN 2 % EX OINT: TOPICAL_OINTMENT | CUTANEOUS | Status: DC

## 2014-02-09 NOTE — ED Provider Notes (Signed)
CSN: [id number]     Arrival date & time 02/09/14  1222 History  This chart was scribed for non-physician practitioner Kem Parkinson, PA-C working with Mervin Kung, MD by Zettie Pho, ED Scribe. This patient was seen in room APFT24/APFT24 and the patient's care was started at 2:15 PM.    Chief Complaint  Patient presents with  . Abscess   The history is provided by the patient. No language interpreter was used.   HPI Comments: [name] Goodman is a 80 y.o. female who presents to the Emergency Department complaining of an itching, erythematous, painful lesion to the abdomen, just superior to the umbilicus, onset 2 days ago after she reports that she believes she may have been bitten by an insect. Patient states that the itching has been gradually improving, but that the pain has been progressively worsening. She denies any drainage from the area. She reports applying alcohol to the area at home without significant relief. Patient reports a history of similar symptoms several months ago after an insect bite. She denies fever, chills, vomiting. She denies any allergies to medications. Nothing makes the sx's better or worse.  Patient has a history of HTN, GERD, asthma, and colon cancer.   Past Medical History  Diagnosis Date  . Hypertension   . GERD (gastroesophageal reflux disease)   . Anxiety   . Depression   . Asthma   . Cancer 2010    Dr Arnoldo Morale, Stage I rectal cancer  . Arthritis    Past Surgical History  Procedure Laterality Date  . Colon resection  2010    rectal cancer  . Cataract extraction Left   . Cataract extraction w/phaco Right 05/12/2013    Procedure: CATARACT EXTRACTION PHACO AND INTRAOCULAR LENS PLACEMENT (IOC);  Surgeon: Tonny Branch, MD;  Location: [facility];  Service: Ophthalmology;  Laterality: Right;  CDE 25.23  . Ileocolonoscopy  03/08/2009    MVE:HMCN surgical scar present in rectal mucosa with polypoid lesion,overlying it as described above, status post a clean  resection/Pan colonic diverticula, remainder of colonic mucosa appeared  . Esophagogastroduodenoscopy  03/08/2009    OBS:JGGEZM esophagus, small hiatal hernia, otherwise normal  . Colonoscopy N/A 09/16/2013    Procedure: COLONOSCOPY;  Surgeon: Danie Binder, MD;  Location: [facility];  Service: Endoscopy;  Laterality: N/A;  9:45-moved to 955   Family History  Problem Relation Age of Onset  . Colon cancer Neg Hx    History  Substance Use Topics  . Smoking status: Never Smoker   . Smokeless tobacco: Not on file  . Alcohol Use: No   OB History   Grav Para Term Preterm Abortions TAB SAB Ect Mult Living                 Review of Systems  Constitutional: Negative for fever, chills and fatigue.  HENT: Negative for sore throat and trouble swallowing.   Respiratory: Negative for cough, shortness of breath and wheezing.   Cardiovascular: Negative for chest pain and palpitations.  Gastrointestinal: Negative for nausea, vomiting, abdominal pain and blood in stool.  Genitourinary: Negative for dysuria, hematuria and flank pain.  Musculoskeletal: Negative for arthralgias, back pain, myalgias, neck pain and neck stiffness.  Skin: Positive for color change (erythema). Negative for rash.       Insect bite  Neurological: Negative for dizziness, weakness and numbness.  Hematological: Does not bruise/bleed easily.      Allergies  Review of patient's allergies indicates no known allergies.  Home Medications  Prior to Admission medications   Medication Sig Start Date End Date Taking? Authorizing Provider  amLODipine (NORVASC) 10 MG tablet Take 10 mg by mouth daily.      Historical Provider, MD  fish oil-omega-3 fatty acids 1000 MG capsule Take 1 g by mouth daily.    Historical Provider, MD  glucosamine-chondroitin 500-400 MG tablet Take 1 tablet by mouth 2 (two) times daily.    Historical Provider, MD  hydrALAZINE (APRESOLINE) 25 MG tablet Take 25 mg by mouth daily.      Historical  Provider, MD  hydrochlorothiazide (HYDRODIURIL) 25 MG tablet Take 25 mg by mouth daily.    Historical Provider, MD  montelukast (SINGULAIR) 10 MG tablet Take 10 mg by mouth at bedtime.    Historical Provider, MD  omeprazole (PRILOSEC) 20 MG capsule Take 20 mg by mouth daily.    Historical Provider, MD   Triage Vitals: BP 169/83  Pulse 85  Temp(Src) 98.1 F (36.7 C)  Resp 18  Ht 5\' 3"  (1.6 m)  Wt 165 lb (74.844 kg)  BMI 29.24 kg/m2  SpO2 96%  Physical Exam  Nursing note and vitals reviewed. Constitutional: She is oriented to person, place, and time. She appears well-developed and well-nourished. No distress.  Patient is well-appearing and non-toxic.   HENT:  Head: Normocephalic and atraumatic.  Mouth/Throat: Oropharynx is clear and moist.  Neck: Normal range of motion. Neck supple.  Cardiovascular: Normal rate, regular rhythm and normal heart sounds.   Pulmonary/Chest: Effort normal and breath sounds normal. No respiratory distress. She exhibits no tenderness.  Abdominal: Soft. She exhibits no distension. There is no tenderness.  Musculoskeletal: Normal range of motion. She exhibits no tenderness.  Lymphadenopathy:    She has no cervical adenopathy.  Neurological: She is alert and oriented to person, place, and time. She exhibits normal muscle tone. Coordination normal.  Ambulatory without difficulty.   Skin: Skin is warm and dry. There is erythema.  Less than 1 cm pustule to the mid abdominal wall with approximately 4 cm of surrounding erythema and induration. No fluctuance or drainage.     ED Course  Procedures (including critical care time)  DIAGNOSTIC STUDIES: Oxygen Saturation is 96% on room air, normal by my interpretation.    COORDINATION OF CARE: 2:20 PM- Discussed that symptoms appear to be due to a developing abscess, incision and drainage  not indicated at this time. Will discharge patient with Bactrim and Keflex to manage symptoms. Advised of further symptomatic  care at home. Advised patient to follow up for a recheck in 2 days, especially if symptoms do not improve with treatment. Return precautions given. Discussed treatment plan with patient at bedside and patient verbalized agreement.     Labs Review Labs Reviewed - No data to display  Imaging Review No results found.   EKG Interpretation None      MDM   Final diagnoses:  Abscess    Pt is well appearing, VSS.  Likely early abscess vs insect bite.  She agrees to care plan and verbalized understanding to return here in 1-2 days if sx's are worsening.  She appears stable for d/c  I personally performed the services described in this documentation, which was scribed in my presence. The recorded information has been reviewed and is accurate.     Dhamar Gregory L. Vanessa Antelope, PA-C 02/10/14 2143

## 2014-02-09 NOTE — ED Notes (Signed)
Abscess to mid abd . Pt awakened Saturday night , felt she had been bitten  By insect. Since then , increasing pain and redness.  No drainage at present.

## 2014-02-09 NOTE — ED Notes (Signed)
Abscess to lower abd x 2 days.

## 2014-02-09 NOTE — Discharge Instructions (Signed)

## 2014-02-10 ENCOUNTER — Encounter (HOSPITAL_COMMUNITY): Payer: Self-pay | Admitting: Emergency Medicine

## 2014-02-10 ENCOUNTER — Emergency Department (HOSPITAL_COMMUNITY)
Admission: EM | Admit: 2014-02-10 | Discharge: 2014-02-10 | Disposition: A | Discharge status: Home / Self Care | Payer: Medicare HMO | Attending: Emergency Medicine | Admitting: Emergency Medicine

## 2014-02-10 DIAGNOSIS — Z9889 Other specified postprocedural states: Secondary | ICD-10-CM | POA: Insufficient documentation

## 2014-02-10 DIAGNOSIS — Z792 Long term (current) use of antibiotics: Secondary | ICD-10-CM | POA: Insufficient documentation

## 2014-02-10 DIAGNOSIS — F411 Generalized anxiety disorder: Secondary | ICD-10-CM | POA: Insufficient documentation

## 2014-02-10 DIAGNOSIS — L0390 Cellulitis, unspecified: Secondary | ICD-10-CM

## 2014-02-10 DIAGNOSIS — M129 Arthropathy, unspecified: Secondary | ICD-10-CM | POA: Insufficient documentation

## 2014-02-10 DIAGNOSIS — F3289 Other specified depressive episodes: Secondary | ICD-10-CM | POA: Insufficient documentation

## 2014-02-10 DIAGNOSIS — K219 Gastro-esophageal reflux disease without esophagitis: Secondary | ICD-10-CM | POA: Insufficient documentation

## 2014-02-10 DIAGNOSIS — L03319 Cellulitis of trunk, unspecified: Principal | ICD-10-CM

## 2014-02-10 DIAGNOSIS — L02219 Cutaneous abscess of trunk, unspecified: Secondary | ICD-10-CM | POA: Insufficient documentation

## 2014-02-10 DIAGNOSIS — Z85048 Personal history of other malignant neoplasm of rectum, rectosigmoid junction, and anus: Secondary | ICD-10-CM | POA: Insufficient documentation

## 2014-02-10 DIAGNOSIS — I10 Essential (primary) hypertension: Secondary | ICD-10-CM | POA: Insufficient documentation

## 2014-02-10 DIAGNOSIS — Z79899 Other long term (current) drug therapy: Secondary | ICD-10-CM | POA: Insufficient documentation

## 2014-02-10 DIAGNOSIS — J45909 Unspecified asthma, uncomplicated: Secondary | ICD-10-CM | POA: Insufficient documentation

## 2014-02-10 DIAGNOSIS — L0291 Cutaneous abscess, unspecified: Secondary | ICD-10-CM

## 2014-02-10 DIAGNOSIS — F329 Major depressive disorder, single episode, unspecified: Secondary | ICD-10-CM | POA: Insufficient documentation

## 2014-02-10 LAB — CBC WITH DIFFERENTIAL/PLATELET
BASOS PCT: 0 % (ref 0–1)
Basophils Absolute: 0 10*3/uL (ref 0.0–0.1)
EOS ABS: 0.1 10*3/uL (ref 0.0–0.7)
EOS PCT: 1 % (ref 0–5)
HCT: 41 % (ref 36.0–46.0)
HEMOGLOBIN: 14.2 g/dL (ref 12.0–15.0)
Lymphocytes Relative: 22 % (ref 12–46)
Lymphs Abs: 2 10*3/uL (ref 0.7–4.0)
MCH: 30 pg (ref 26.0–34.0)
MCHC: 34.6 g/dL (ref 30.0–36.0)
MCV: 86.5 fL (ref 78.0–100.0)
MONO ABS: 0.9 10*3/uL (ref 0.1–1.0)
MONOS PCT: 9 % (ref 3–12)
NEUTROS PCT: 68 % (ref 43–77)
Neutro Abs: 6.3 10*3/uL (ref 1.7–7.7)
Platelets: 217 10*3/uL (ref 150–400)
RBC: 4.74 MIL/uL (ref 3.87–5.11)
RDW: 12.9 % (ref 11.5–15.5)
WBC: 9.2 10*3/uL (ref 4.0–10.5)

## 2014-02-10 LAB — BASIC METABOLIC PANEL
BUN: 18 mg/dL (ref 6–23)
CALCIUM: 10.1 mg/dL (ref 8.4–10.5)
CO2: 22 mEq/L (ref 19–32)
CREATININE: 1.19 mg/dL — AB (ref 0.50–1.10)
Chloride: 104 mEq/L (ref 96–112)
GFR, EST AFRICAN AMERICAN: 49 mL/min — AB (ref >90)
GFR, EST NON AFRICAN AMERICAN: 42 mL/min — AB (ref >90)
Glucose, Bld: 106 mg/dL — ABNORMAL HIGH (ref 70–99)
POTASSIUM: 4.4 meq/L (ref 3.7–5.3)
Sodium: 140 mEq/L (ref 137–147)

## 2014-02-10 MED ORDER — HYDROCODONE-ACETAMINOPHEN 5-325 MG PO TABS: 2.0000 | ORAL_TABLET | ORAL | Status: DC | PRN

## 2014-02-10 NOTE — ED Notes (Signed)
PA at bedside to debris abscess.

## 2014-02-10 NOTE — ED Provider Notes (Signed)
CSN: [id number]     Arrival date & time 02/10/14  1158 History   First MD Initiated Contact with Patient 02/10/14 1212     Chief Complaint  Patient presents with  . Abscess  . Abdominal Pain     (Consider location/radiation/quality/duration/timing/severity/associated sxs/prior Treatment) HPI  [name] Goodman Is a 80 year old female without significant past medical history who presents the emergency department with chief complaint of abscess.  The patient was [facility] yesterday at  [facility] .  Patient states that Saturday night in the middle of sleep he awoke with sudden onset burning and itching of the abdomen.  She states she feels she might have been bitten by a spider.  The next day she had swelling, and to keep pain.  Patient was seen yesterday which is Monday 4/27 symptoms as stated above.  She was diagnosed with abdominal abscess and cellulitis, deep vein panel, Keflex and mupirocin at discharge.  The patient states that she's been taking medications as directed by states she feels she might still have a spider bite and was concerned.  She denies any worsening of her symptoms.  She is only taken 3 doses of her medication. She denies fevers, chills, myalgias or other signs of systemic infection. Past Medical History  Diagnosis Date  . Hypertension   . GERD (gastroesophageal reflux disease)   . Anxiety   . Depression   . Asthma   . Cancer 2010    Dr Arnoldo Morale, Stage I rectal cancer  . Arthritis    Past Surgical History  Procedure Laterality Date  . Colon resection  2010    rectal cancer  . Cataract extraction Left   . Cataract extraction w/phaco Right 05/12/2013    Procedure: CATARACT EXTRACTION PHACO AND INTRAOCULAR LENS PLACEMENT (IOC);  Surgeon: Tonny Branch, MD;  Location: [facility];  Service: Ophthalmology;  Laterality: Right;  CDE 25.23  . Ileocolonoscopy  03/08/2009    TGG:YIRS surgical scar present in rectal mucosa with polypoid lesion,overlying it as described above, status post a clean  resection/Pan colonic diverticula, remainder of colonic mucosa appeared  . Esophagogastroduodenoscopy  03/08/2009    WNI:OEVOJJ esophagus, small hiatal hernia, otherwise normal  . Colonoscopy N/A 09/16/2013    Procedure: COLONOSCOPY;  Surgeon: Danie Binder, MD;  Location: [facility];  Service: Endoscopy;  Laterality: N/A;  9:45-moved to 955   Family History  Problem Relation Age of Onset  . Colon cancer Neg Hx    History  Substance Use Topics  . Smoking status: Never Smoker   . Smokeless tobacco: Not on file  . Alcohol Use: No   OB History   Grav Para Term Preterm Abortions TAB SAB Ect Mult Living                 Review of Systems   Ten systems reviewed and are negative for acute change, except as noted in the HPI.   Allergies  Review of patient's allergies indicates no known allergies.  Home Medications   Prior to Admission medications   Medication Sig Start Date End Date Taking? Authorizing Provider  amLODipine (NORVASC) 10 MG tablet Take 10 mg by mouth at bedtime.     Historical Provider, MD  cephALEXin (KEFLEX) 500 MG capsule Take 1 capsule (500 mg total) by mouth 4 (four) times daily. For 7 days 02/09/14   Tammy L. Triplett, PA-C  fish oil-omega-3 fatty acids 1000 MG capsule Take 1 g by mouth daily.    Historical Provider, MD  glucosamine-chondroitin  500-400 MG tablet Take 1 tablet by mouth 2 (two) times daily.    Historical Provider, MD  hydrALAZINE (APRESOLINE) 25 MG tablet Take 25 mg by mouth daily.      Historical Provider, MD  hydrochlorothiazide (HYDRODIURIL) 25 MG tablet Take 25 mg by mouth daily as needed.     Historical Provider, MD  montelukast (SINGULAIR) 10 MG tablet Take 10 mg by mouth at bedtime.    Historical Provider, MD  mupirocin ointment (BACTROBAN) 2 % Apply to affected area TID x 10 days 02/09/14   Tammy L. Triplett, PA-C  omeprazole (PRILOSEC) 20 MG capsule Take 20 mg by mouth daily.    Historical Provider, MD  sulfamethoxazole-trimethoprim  (BACTRIM DS) 800-160 MG per tablet Take 1 tablet by mouth 2 (two) times daily. For 10 days 02/09/14   Tammy L. Triplett, PA-C   BP 131/58  Pulse 78  Temp(Src) 98.2 F (36.8 C) (Oral)  Resp 20  Ht 5' 5.5" (1.664 m)  Wt 165 lb (74.844 kg)  BMI 27.03 kg/m2  SpO2 100% Physical Exam Physical Exam  Nursing note and vitals reviewed.  Constitutional: She is oriented to person, place, and time. She appears well-developed and well-nourished. No distress.  Patient is well-appearing and non-toxic.  HENT:  Head: Normocephalic and atraumatic.  Mouth/Throat: Oropharynx is clear and moist.  Neck: Normal range of motion. Neck supple.  Cardiovascular: Normal rate, regular rhythm and normal heart sounds.  Pulmonary/Chest: Effort normal and breath sounds normal. No respiratory distress. She exhibits no tenderness.  Abdominal: Soft. She exhibits no distension. There is no tenderness.  Musculoskeletal: Normal range of motion. She exhibits no tenderness.  Lymphadenopathy:  She has no cervical adenopathy.  Neurological: She is alert and oriented to person, place, and time. She exhibits normal muscle tone. Coordination normal.  Ambulatory without difficulty.  Skin: Skin is warm and dry. There is erythema,  Less than 1 cm pustule  With crusting to the mid abdominal wall with 10 cm of erythema which is poorly demarcated. 4 cm of surrounding induration. No fluctuance.  ED Course  Procedures (including critical care time) Labs Review Labs Reviewed  BASIC METABOLIC PANEL - Abnormal; Notable for the following:    Glucose, Bld 106 (*)    Creatinine, Ser 1.19 (*)    GFR calc non Af Amer 42 (*)    GFR calc Af Amer 49 (*)    All other components within normal limits  CULTURE, ROUTINE-ABSCESS  CBC WITH DIFFERENTIAL    Imaging Review No results found.   EKG Interpretation None      INCISION AND DRAINAGE Performed by: Margarita Mail Consent: Verbal consent obtained. Risks and benefits: risks,  benefits and alternatives were discussed Type: abscess  Body area: abdomen  Anesthesia: local infiltration  Incision was made with a scalpel.  Local anesthetic: lidocaine 2% w/o epinephrine  Anesthetic total: 4 ml  Complexity: complex Blunt dissection to break up loculations  Drainage: purulent  Drainage amount: moderate  Packing material: 1/4 in iodoform gauze  Patient tolerance: Patient tolerated the procedure well with no immediate complications.    MDM   Final diagnoses:  Abscess and cellulitis   Patient with abscess and cellulitis and abscess. Labs appears at baseline. I have demarcated her wound and sent culture. Patient is to continue with her Bactrim, Keflex.  She may apply mupirocin to the surrounding tissues.  She is to followup in 2 days with her primary care deck doctor for wound check or sooner for fever, chills,  or worsening signs of infection outside of demarcated area.   The patient and her daughter verbalize understanding and agree with POC.    Margarita Mail, PA-C 02/10/14 1559

## 2014-02-10 NOTE — ED Notes (Signed)
Reports having insect bite/abscess to abd since Saturday night. Having burning pain, swelling to abd, redness noted and reports low grade fever.

## 2014-02-10 NOTE — Discharge Instructions (Signed)
Abscess Care After An abscess (also called a boil or furuncle) is an infected area that contains a collection of pus. Signs and symptoms of an abscess include pain, tenderness, redness, or hardness, or you may feel a moveable soft area under your skin. An abscess can occur anywhere in the body. The infection may spread to surrounding tissues causing cellulitis. A cut (incision) by the surgeon was made over your abscess and the pus was drained out. Gauze may have been packed into the space to provide a drain that will allow the cavity to heal from the inside outwards. The boil may be painful for 5 to 7 days. Most people with a boil do not have high fevers. Your abscess, if seen early, may not have localized, and may not have been lanced. If not, another appointment may be required for this if it does not get better on its own or with medications. HOME CARE INSTRUCTIONS   Only take over-the-counter or prescription medicines for pain, discomfort, or fever as directed by your caregiver.  When you bathe, soak and then remove gauze or iodoform packs at least daily or as directed by your caregiver. You may then wash the wound gently with mild soapy water. Repack with gauze or do as your caregiver directs. SEEK IMMEDIATE MEDICAL CARE IF:   You develop increased pain, swelling, redness, drainage, or bleeding in the wound site.  You develop signs of generalized infection including muscle aches, chills, fever, or a general ill feeling.  An oral temperature above 102 F (38.9 C) develops, not controlled by medication. See your caregiver for a recheck if you develop any of the symptoms described above. If medications (antibiotics) were prescribed, take them as directed. Document Released: 04/20/2005 Document Revised: 12/25/2011 Document Reviewed: 12/16/2007 [facility] Patient Information 2014 [place].  Cellulitis Cellulitis is an infection of the skin and the tissue beneath it. The infected area is  usually red and tender. Cellulitis occurs most often in the arms and lower legs.  CAUSES  Cellulitis is caused by bacteria that enter the skin through cracks or cuts in the skin. The most common types of bacteria that cause cellulitis are Staphylococcus and Streptococcus. SYMPTOMS   Redness and warmth.  Swelling.  Tenderness or pain.  Fever. DIAGNOSIS  Your caregiver can usually determine what is wrong based on a physical exam. Blood tests may also be done. TREATMENT  Treatment usually involves taking an antibiotic medicine. HOME CARE INSTRUCTIONS   Take your antibiotics as directed. Finish them even if you start to feel better.  Keep the infected arm or leg elevated to reduce swelling.  Apply a warm cloth to the affected area up to 4 times per day to relieve pain.  Only take over-the-counter or prescription medicines for pain, discomfort, or fever as directed by your caregiver.  Keep all follow-up appointments as directed by your caregiver. SEEK MEDICAL CARE IF:   You notice red streaks coming from the infected area.  Your red area gets larger or turns dark in color.  Your bone or joint underneath the infected area becomes painful after the skin has healed.  Your infection returns in the same area or another area.  You notice a swollen bump in the infected area.  You develop new symptoms. SEEK IMMEDIATE MEDICAL CARE IF:   You have a fever.  You feel very sleepy.  You develop vomiting or diarrhea.  You have a general ill feeling (malaise) with muscle aches and pains. MAKE SURE YOU:  Understand these instructions.  Will watch your condition.  Will get help right away if you are not doing well or get worse. Document Released: 07/12/2005 Document Revised: 04/02/2012 Document Reviewed: 12/18/2011 [facility] Patient Information 2014 [place]. Acetaminophen; Hydrocodone tablets or capsules What is this medicine? ACETAMINOPHEN; HYDROCODONE (a set a MEE noe  fen; hye droe KOE done) is a pain reliever. It is used to treat mild to moderate pain. This medicine may be used for other purposes; ask your health care provider or pharmacist if you have questions. COMMON BRAND NAME(S): Anexsia, Bancap HC , Ceta-Plus, Co-Gesic, Comfortpak , Dolagesic, [employer], DuoCet , Hydrocet , Hydrogesic, Lorcet HD, Lorcet Plus, Lorcet, [place], Margesic H, Maxidone, [place], Polygesic, [place], [place], Vicodin ES, Vicodin HP, Vicodin, Charlane Ferretti What should I tell my health care provider before I take this medicine? They need to know if you have any of these conditions: -brain tumor -Crohn's disease, inflammatory bowel disease, or ulcerative colitis -drug abuse or addiction -head injury -heart or circulation problems -if you often drink alcohol -kidney disease or problems going to the bathroom -liver disease -lung disease, asthma, or breathing problems -an unusual or allergic reaction to acetaminophen, hydrocodone, other opioid analgesics, other medicines, foods, dyes, or preservatives -pregnant or trying to get pregnant -breast-feeding How should I use this medicine? Take this medicine by mouth. Swallow it with a full glass of water. Follow the directions on the prescription label. If the medicine upsets your stomach, take the medicine with food or milk. Do not take more than you are told to take. Talk to your pediatrician regarding the use of this medicine in children. This medicine is not approved for use in children. Overdosage: If you think you have taken too much of this medicine contact a poison control center or emergency room at once. NOTE: This medicine is only for you. Do not share this medicine with others. What if I miss a dose? If you miss a dose, take it as soon as you can. If it is almost time for your next dose, take only that dose. Do not take double or extra doses. What may interact with this  medicine? -alcohol -antihistamines -isoniazid -medicines for depression, anxiety, or psychotic disturbances -medicines for sleep -muscle relaxants -naltrexone -narcotic medicines (opiates) for pain -phenobarbital -ritonavir -tramadol This list may not describe all possible interactions. Give your health care provider a list of all the medicines, herbs, non-prescription drugs, or dietary supplements you use. Also tell them if you smoke, drink alcohol, or use illegal drugs. Some items may interact with your medicine. What should I watch for while using this medicine? Tell your doctor or health care professional if your pain does not go away, if it gets worse, or if you have new or a different type of pain. You may develop tolerance to the medicine. Tolerance means that you will need a higher dose of the medicine for pain relief. Tolerance is normal and is expected if you take the medicine for a long time. Do not suddenly stop taking your medicine because you may develop a severe reaction. Your body becomes used to the medicine. This does NOT mean you are addicted. Addiction is a behavior related to getting and using a drug for a non-medical reason. If you have pain, you have a medical reason to take pain medicine. Your doctor will tell you how much medicine to take. If your doctor wants you to stop the medicine, the dose will be slowly lowered over time to  avoid any side effects. You may get drowsy or dizzy when you first start taking the medicine or change doses. Do not drive, use machinery, or do anything that may be dangerous until you know how the medicine affects you. Stand or sit up slowly. There are different types of narcotic medicines (opiates) for pain. If you take more than one type at the same time, you may have more side effects. Give your health care provider a list of all medicines you use. Your doctor will tell you how much medicine to take. Do not take more medicine than directed.  Call emergency for help if you have problems breathing. The medicine will cause constipation. Try to have a bowel movement at least every 2 to 3 days. If you do not have a bowel movement for 3 days, call your doctor or health care professional. Too much acetaminophen can be very dangerous. Do not take Tylenol (acetaminophen) or medicines that contain acetaminophen with this medicine. Many non-prescription medicines contain acetaminophen. Always read the labels carefully. What side effects may I notice from receiving this medicine? Side effects that you should report to your doctor or health care professional as soon as possible: -allergic reactions like skin rash, itching or hives, swelling of the face, lips, or tongue -breathing problems -confusion -feeling faint or lightheaded, falls -stomach pain -yellowing of the eyes or skin Side effects that usually do not require medical attention (report to your doctor or health care professional if they continue or are bothersome): -nausea, vomiting -stomach upset This list may not describe all possible side effects. Call your doctor for medical advice about side effects. You may report side effects to FDA at 1-800-FDA-1088. Where should I keep my medicine? Keep out of the reach of children. This medicine can be abused. Keep your medicine in a safe place to protect it from theft. Do not share this medicine with anyone. Selling or giving away this medicine is dangerous and against the law. Store at room temperature between 15 and 30 degrees C (59 and 86 degrees F). Protect from light. Keep container tightly closed.  Throw away any unused medicine after the expiration date. Discard unused medicine and used packaging carefully. Pets and children can be harmed if they find used or lost packages. NOTE: This sheet is a summary. It may not cover all possible information. If you have questions about this medicine, talk to your doctor, pharmacist, or health care  provider.  2014, Elsevier/Gold Standard. (2013-05-26 13:15:56)

## 2014-02-11 NOTE — ED Provider Notes (Signed)
Medical screening examination/treatment/procedure(s) were performed by non-physician practitioner and as supervising physician I was immediately available for consultation/collaboration.   EKG Interpretation None        Mervin Kung, MD 02/11/14 [id number]

## 2014-02-12 NOTE — ED Provider Notes (Signed)
Medical screening examination/treatment/procedure(s) were performed by non-physician practitioner and as supervising physician I was immediately available for consultation/collaboration.  Saddie Benders. Dorna Mai, MD 02/12/14 [phone]

## 2014-02-16 ENCOUNTER — Telehealth (HOSPITAL_BASED_OUTPATIENT_CLINIC_OR_DEPARTMENT_OTHER): Payer: Self-pay

## 2014-02-16 LAB — CULTURE, ROUTINE-ABSCESS: Gram Stain: NONE SEEN

## 2014-02-16 NOTE — Telephone Encounter (Signed)
Call rcvd from [facility] w/(+) abd abscess cx (+) for moderate MRSA.  Pt given Rx for Bactrim -> sens. to same and Keflex -> no sensitivities provided.  Call and notify pt (Sensitivities never resulted in our EPIC system so call [facility] and was faxed a copy of results)

## 2014-02-16 NOTE — Telephone Encounter (Signed)
Post ED Visit - Positive Culture Follow-up  Culture report reviewed by antimicrobial stewardship pharmacist: []  Wes Speed, Pharm.D., BCPS [x]  Heide Guile, Pharm.D., BCPS []  Alycia Rossetti, Pharm.D., BCPS []  August, Pharm.D., BCPS, AAHIVP []  Legrand Como, Pharm.D., BCPS, AAHIVP []  Juliene Pina, Pharm.D.  Positive abscess culture Treated with Bactrim, organism sensitive to the same and no further patient follow-up is required at this time.  [place] 02/16/2014, 7:12 PM

## 2014-02-26 ENCOUNTER — Telehealth (HOSPITAL_BASED_OUTPATIENT_CLINIC_OR_DEPARTMENT_OTHER): Payer: Self-pay | Admitting: Emergency Medicine

## 2014-02-26 NOTE — Telephone Encounter (Signed)
Patient notified of + abscess culture and + MRSA. Patient states she feels better and has followed-up with her PCP.

## 2014-04-21 ENCOUNTER — Other Ambulatory Visit (HOSPITAL_COMMUNITY): Payer: Self-pay | Admitting: Family Medicine

## 2014-04-21 DIAGNOSIS — Z1231 Encounter for screening mammogram for malignant neoplasm of breast: Secondary | ICD-10-CM

## 2014-04-29 ENCOUNTER — Ambulatory Visit (HOSPITAL_COMMUNITY)
Admission: RE | Admit: 2014-04-29 | Discharge: 2014-04-29 | Disposition: A | Discharge status: Home / Self Care | Payer: Medicare HMO | Source: Ambulatory Visit | Attending: Family Medicine | Admitting: Family Medicine

## 2014-04-29 DIAGNOSIS — Z1231 Encounter for screening mammogram for malignant neoplasm of breast: Secondary | ICD-10-CM

## 2015-08-16 ENCOUNTER — Other Ambulatory Visit (HOSPITAL_COMMUNITY): Payer: Self-pay | Admitting: Diagnostic Radiology

## 2017-04-15 ENCOUNTER — Emergency Department (HOSPITAL_COMMUNITY): Payer: Medicare HMO

## 2017-04-15 ENCOUNTER — Encounter (HOSPITAL_COMMUNITY): Payer: Self-pay | Admitting: Emergency Medicine

## 2017-04-15 ENCOUNTER — Emergency Department (HOSPITAL_COMMUNITY)
Admission: EM | Admit: 2017-04-15 | Discharge: 2017-04-15 | Disposition: A | Discharge status: Home / Self Care | Payer: Medicare HMO | Attending: Emergency Medicine | Admitting: Emergency Medicine

## 2017-04-15 DIAGNOSIS — I10 Essential (primary) hypertension: Secondary | ICD-10-CM | POA: Insufficient documentation

## 2017-04-15 DIAGNOSIS — R55 Syncope and collapse: Secondary | ICD-10-CM | POA: Diagnosis present

## 2017-04-15 DIAGNOSIS — J45909 Unspecified asthma, uncomplicated: Secondary | ICD-10-CM | POA: Insufficient documentation

## 2017-04-15 DIAGNOSIS — R531 Weakness: Secondary | ICD-10-CM | POA: Insufficient documentation

## 2017-04-15 DIAGNOSIS — Z79899 Other long term (current) drug therapy: Secondary | ICD-10-CM | POA: Diagnosis not present

## 2017-04-15 LAB — COMPREHENSIVE METABOLIC PANEL
ALT: 11 U/L — ABNORMAL LOW (ref 14–54)
AST: 18 U/L (ref 15–41)
Albumin: 4 g/dL (ref 3.5–5.0)
Alkaline Phosphatase: 108 U/L (ref 38–126)
Anion gap: 8 (ref 5–15)
BUN: 17 mg/dL (ref 6–20)
CHLORIDE: 108 mmol/L (ref 101–111)
CO2: 26 mmol/L (ref 22–32)
Calcium: 10.4 mg/dL — ABNORMAL HIGH (ref 8.9–10.3)
Creatinine, Ser: 1.09 mg/dL — ABNORMAL HIGH (ref 0.44–1.00)
GFR calc non Af Amer: 46 mL/min — ABNORMAL LOW (ref >60)
GFR, EST AFRICAN AMERICAN: 53 mL/min — AB (ref >60)
Glucose, Bld: 120 mg/dL — ABNORMAL HIGH (ref 65–99)
Potassium: 3.8 mmol/L (ref 3.5–5.1)
SODIUM: 142 mmol/L (ref 135–145)
Total Bilirubin: 0.4 mg/dL (ref 0.3–1.2)
Total Protein: 7.8 g/dL (ref 6.5–8.1)

## 2017-04-15 LAB — CBC WITH DIFFERENTIAL/PLATELET
Basophils Absolute: 0 10*3/uL (ref 0.0–0.1)
Basophils Relative: 0 %
EOS ABS: 0.2 10*3/uL (ref 0.0–0.7)
EOS PCT: 2 %
HCT: 39.5 % (ref 36.0–46.0)
Hemoglobin: 13.1 g/dL (ref 12.0–15.0)
LYMPHS ABS: 2 10*3/uL (ref 0.7–4.0)
Lymphocytes Relative: 30 %
MCH: 29.2 pg (ref 26.0–34.0)
MCHC: 33.2 g/dL (ref 30.0–36.0)
MCV: 88 fL (ref 78.0–100.0)
Monocytes Absolute: 0.4 10*3/uL (ref 0.1–1.0)
Monocytes Relative: 6 %
Neutro Abs: 4.1 10*3/uL (ref 1.7–7.7)
Neutrophils Relative %: 62 %
Platelets: 222 10*3/uL (ref 150–400)
RBC: 4.49 MIL/uL (ref 3.87–5.11)
RDW: 13.2 % (ref 11.5–15.5)
WBC: 6.7 10*3/uL (ref 4.0–10.5)

## 2017-04-15 LAB — URINALYSIS, ROUTINE W REFLEX MICROSCOPIC
BACTERIA UA: NONE SEEN
Bilirubin Urine: NEGATIVE
GLUCOSE, UA: NEGATIVE mg/dL
Ketones, ur: NEGATIVE mg/dL
Leukocytes, UA: NEGATIVE
NITRITE: NEGATIVE
Protein, ur: NEGATIVE mg/dL
Specific Gravity, Urine: 1.004 — ABNORMAL LOW (ref 1.005–1.030)
pH: 7 (ref 5.0–8.0)

## 2017-04-15 LAB — CBG MONITORING, ED: Glucose-Capillary: 117 mg/dL — ABNORMAL HIGH (ref 65–99)

## 2017-04-15 MED ORDER — LACTATED RINGERS IV BOLUS (SEPSIS)
1000.0000 mL | Freq: Once | INTRAVENOUS | Status: AC
  Administered 2017-04-15: 1000 mL via INTRAVENOUS

## 2017-04-15 NOTE — ED Provider Notes (Signed)
[facility] DEPT Provider Note   CSN: [id number] Arrival date & time: 04/15/17  1747     History   Chief Complaint Chief Complaint  Patient presents with  . Near Syncope    HPI ALYSSON GEIST is a 83 y.o. female.  The history is provided by the patient.  Near Syncope  This is a new problem. The problem occurs constantly. Pertinent negatives include no chest pain and no shortness of breath. Nothing aggravates the symptoms. Nothing relieves the symptoms. She has tried nothing for the symptoms.   83 year old female who had some weakness tonight. She says she stood up from the table and she was trying to go get a fork and she had weakness in her legs and fell to the floor. She did not hit her head or pass out. She did not feel lightheaded, chest pain or other symptoms. She got up on her own and went to walk again and felt weak again and fell again. Again no associated symptoms with this either. She does say she doesn't drink as much water she should but this is not a new problem. Her family states that her home is very hot and she may be dehydrated.  She does not note any headache, chest pain, abdominal pain, or other associated symptoms.   Past Medical History:  Diagnosis Date  . Anxiety   . Arthritis   . Asthma   . Cancer [facility]) 2010   Dr Arnoldo Morale, Stage I rectal cancer  . Depression   . GERD (gastroesophageal reflux disease)   . Hypertension      Patient Active Problem List   Diagnosis Date Noted  . GASTROINTESTINAL HEMORRHAGE, HX OF 04/28/2009  . HYPERTENSION 04/26/2009  . ARTHRITIS 04/26/2009  . Malignant neoplasm of rectum ([place]) 11/10/2008    Past Surgical History:  Procedure Laterality Date  . CATARACT EXTRACTION Left   . CATARACT EXTRACTION W/PHACO Right 05/12/2013   Procedure: CATARACT EXTRACTION PHACO AND INTRAOCULAR LENS PLACEMENT (IOC);  Surgeon: Tonny Branch, MD;  Location: [facility] OR[facility];  Service: Ophthalmology;  Laterality: Right;  CDE 25.23  . COLON RESECTION  2010    rectal cancer  . COLONOSCOPY N/A 09/16/2013   Procedure: COLONOSCOPY;  Surgeon: Danie Binder, MD;  Location: [facility] ENDO SUITE;  Service: Endoscopy;  Laterality: N/A;  9:45-moved to 955  . ESOPHAGOGASTRODUODENOSCOPY  03/08/2009   [id number] esophagus, small hiatal hernia, otherwise normal  . Ileocolonoscopy  03/08/2009   NFA:OZHY surgical scar present in rectal mucosa with polypoid lesion,overlying it as described above, status post a clean resection/Pan colonic diverticula, remainder of colonic mucosa appeared    OB History    No data available       Home Medications    Prior to Admission medications   Medication Sig Start Date End Date Taking? Authorizing Provider  acetaminophen (TYLENOL) 325 MG tablet Take 650 mg by mouth every 6 (six) hours as needed for mild pain, moderate pain or headache.   Yes [provider]  albuterol (PROVENTIL HFA;VENTOLIN HFA) 108 (90 BASE) MCG/ACT inhaler Inhale 2 puffs into the lungs every 6 (six) hours as needed for wheezing or shortness of breath.   Yes [provider]  albuterol (PROVENTIL) (2.5 MG/3ML) 0.083% nebulizer solution Take 2.5 mg by nebulization every 6 (six) hours as needed for wheezing or shortness of breath.   Yes [provider]  amLODipine (NORVASC) 5 MG tablet Take 5 mg by mouth daily.   Yes [provider]  BIOTIN PO Take  1 tablet by mouth daily.   Yes [provider]  levothyroxine (SYNTHROID, LEVOTHROID) 50 MCG tablet Take 1 tablet by mouth daily. 03/21/17  Yes [provider]  omeprazole (PRILOSEC) 20 MG capsule Take 20 mg by mouth daily.   Yes [provider]  sertraline (ZOLOFT) 50 MG tablet Take 1 tablet by mouth daily. 05/10/16  Yes [provider]  Vitamin D, Ergocalciferol, (DRISDOL) 50000 units CAPS capsule Take 50,000 Units by mouth every 7 (seven) days.   Yes [provider]    Family History Family History  Problem Relation Age of Onset  .  Colon cancer Neg Hx     Social History Social History  Substance Use Topics  . Smoking status: Never Smoker  . Smokeless tobacco: Not on file  . Alcohol use No     Allergies   Patient has no known allergies.   Review of Systems Review of Systems  Respiratory: Negative for shortness of breath.   Cardiovascular: Positive for near-syncope. Negative for chest pain.  All other systems reviewed and are negative.    Physical Exam Updated Vital Signs BP (!) 168/71   Pulse (!) 52   Temp 97.4 F (36.3 C) (Oral)   Resp 17   Ht 5\' 2"  (1.575 m)   Wt 74.8 kg (165 lb)   SpO2 100%   BMI 30.18 kg/m   Physical Exam  Constitutional: She is oriented to person, place, and time. She appears well-developed and well-nourished.  HENT:  Head: Normocephalic and atraumatic.  Eyes: Conjunctivae and EOM are normal.  Neck: Normal range of motion.  Cardiovascular: Normal rate and regular rhythm.   Pulmonary/Chest: Effort normal and breath sounds normal. No stridor. No respiratory distress.  Abdominal: Soft. Bowel sounds are normal. She exhibits no distension.  Musculoskeletal: Normal range of motion. She exhibits no edema or deformity.  Neurological: She is alert and oriented to person, place, and time. No cranial nerve deficit. Coordination normal.  No altered mental status, able to give full seemingly accurate history.  Face is symmetric, EOM's intact, pupils equal and reactive, vision intact, tongue and uvula midline without deviation. Upper and Lower extremity motor 5/5, intact pain perception in distal extremities, 2+ reflexes in biceps, patella and achilles tendons. Able to perform finger to nose normal with both hands. After fluids and workup she walks without assistance or evident ataxia.  Skin: Skin is warm and dry. No erythema. No pallor.  Nursing note and vitals reviewed.    ED Treatments / Results  Labs (all labs ordered are listed, but only abnormal results are displayed) Labs  Reviewed  COMPREHENSIVE METABOLIC PANEL - Abnormal; Notable for the following:       Result Value   Glucose, Bld 120 (*)    Creatinine, Ser 1.09 (*)    Calcium 10.4 (*)    ALT 11 (*)    GFR calc non Af Amer 46 (*)    GFR calc Af Amer 53 (*)    All other components within normal limits  URINALYSIS, ROUTINE W REFLEX MICROSCOPIC - Abnormal; Notable for the following:    Color, Urine COLORLESS (*)    Specific Gravity, Urine 1.004 (*)    Hgb urine dipstick MODERATE (*)    Squamous Epithelial / LPF 0-5 (*)    All other components within normal limits  CBG MONITORING, ED - Abnormal; Notable for the following:    Glucose-Capillary 117 (*)    All other components within normal limits  CBC WITH  DIFFERENTIAL/PLATELET    EKG  EKG Interpretation  Date/Time:  Sunday April 15 2017 18:35:07 EDT Ventricular Rate:  51 PR Interval:    QRS Duration: 85 QT Interval:  418 QTC Calculation: 385 R Axis:   20 Text Interpretation:  Sinus rhythm Low voltage, precordial leads Confirmed by Merrily Pew [phone]) on 04/15/2017 7:25:20 PM       Radiology Ct Head Wo Contrast  Result Date: 04/15/2017 CLINICAL DATA:  Two near syncopal episodes today.  Headaches. EXAM: CT HEAD WITHOUT CONTRAST TECHNIQUE: Contiguous axial images were obtained from the base of the skull through the vertex without intravenous contrast. COMPARISON:  None. FINDINGS: Brain: No subdural, epidural, or subarachnoid hemorrhage. Ventricles and sulci are normal for age. Cerebellum, brainstem, and basal cisterns are normal. No mass, mass effect, or midline shift. Mild white matter changes. No acute cortical ischemia or infarct Vascular: Calcified atherosclerosis is seen in the intracranial carotid artery's. Skull: Normal. Negative for fracture or focal lesion. Sinuses/Orbits: No acute finding. Other: None. IMPRESSION: No acute intracranial abnormality to explain the patient's symptoms. Electronically Signed   By: Dorise Bullion III M.D   On:  04/15/2017 20:13    Procedures Procedures (including critical care time)  Medications Ordered in ED Medications  lactated ringers bolus 1,000 mL (0 mLs Intravenous Stopped 04/15/17 2116)     Initial Impression / Assessment and Plan / ED Course  I have reviewed the triage vital signs and the nursing notes.  Pertinent labs & imaging results that were available during my care of the patient were reviewed by me and considered in my medical decision making (see chart for details).    Will do general evaluation for weakness.   Patient significantly improved with fluids. Ambulating without difficulty. Feels at baseline. Will dc, likely dehydration, needs pcp follow up.   Final Clinical Impressions(s) / ED Diagnoses   Final diagnoses:  Weakness     Mina Babula, Corene Cornea, MD 04/15/17 2316

## 2017-04-15 NOTE — ED Triage Notes (Signed)
Pt reports having 2 near syncopal episodes today.  Has been c/o headaches for the past 3 days.  Family states pt has not been using her air conditioning at home and it has been almost 100 degrees in her house.

## 2017-05-31 ENCOUNTER — Ambulatory Visit (INDEPENDENT_AMBULATORY_CARE_PROVIDER_SITE_OTHER): Payer: Medicare HMO | Admitting: Neurology

## 2017-05-31 ENCOUNTER — Encounter: Payer: Self-pay | Admitting: Neurology

## 2017-05-31 DIAGNOSIS — F0391 Unspecified dementia with behavioral disturbance: Secondary | ICD-10-CM | POA: Diagnosis not present

## 2017-05-31 DIAGNOSIS — R413 Other amnesia: Secondary | ICD-10-CM

## 2017-05-31 DIAGNOSIS — F0390 Unspecified dementia without behavioral disturbance: Secondary | ICD-10-CM | POA: Insufficient documentation

## 2017-05-31 MED ORDER — MEMANTINE HCL 10 MG PO TABS: 10.0000 mg | ORAL_TABLET | Freq: Two times a day (BID) | ORAL | 11 refills | Status: DC

## 2017-05-31 MED ORDER — DONEPEZIL HCL 10 MG PO TABS: 10.0000 mg | ORAL_TABLET | Freq: Every day | ORAL | 11 refills | Status: DC

## 2017-05-31 NOTE — Progress Notes (Signed)
PATIENT: [name] Goodman DOB: 04/06/1932  Chief Complaint  Patient presents with  . Memory Loss    [name] Goodman is here with her dtrs [name] Goodman and [name] Goodman for eval of memory loss, first noted after her son's death 2 yrs. ago, then worse after her grandson's death in 02/01/2017.  MMSE today 16/30; 11 animals/fim     HISTORICAL  [name] Goodman is of 83 years old right-hand female, with her 2 daughters Orlene, Salmons, seen in refer by her primary care nurse practitioner Amador Cunas for evaluation of memory loss, initial evaluation was May 31 2017.  I reviewed and summarized the referring note,She had a past medical history of hypertension, hypothyroidism, on supplement, depression, history of stage I rectal cancer, was treated with surgery in 02-01-2009, no chemotherapy or radiation therapy is required, she presented with rectal bleeding at that time.  She retired from [employer], Nursing aide about 20 years ago, She was able to help her nephew, and family members, quit driving around 1517, after family found she has mild memory loss, she tends to repeat herself, tried to minimize her memory problem,  Her mother, sister, , brother and her daughter [name] Goodman at age 25 suffered dementia.  She now lives alone, independent of daily activities,family member check on her frequently,she complains of feeling anxious at evening time, hear people knock on the door, is afraid to open the door   Laboratory evaluation in September 2018:normal CMP with exception of mildly decreased creatinine 1.09,glucose 120, normal CBC,   REVIEW OF SYSTEMS: Full 14 system review of systems performed and notable only for as above  ALLERGIES: No Known Allergies  HOME MEDICATIONS: Current Outpatient Prescriptions  Medication Sig Dispense Refill  . amLODipine (NORVASC) 5 MG tablet Take 5 mg by mouth daily.    Marland Kitchen BIOTIN PO Take 1 tablet by mouth daily.    Marland Kitchen levothyroxine (SYNTHROID, LEVOTHROID) 50 MCG tablet Take 1 tablet by  mouth daily.  5  . omeprazole (PRILOSEC) 20 MG capsule Take 20 mg by mouth daily.    . sertraline (ZOLOFT) 50 MG tablet Take 1 tablet by mouth daily.    . Vitamin D, Ergocalciferol, (DRISDOL) 50000 units CAPS capsule Take 50,000 Units by mouth every 7 (seven) days.    Marland Kitchen acetaminophen (TYLENOL) 325 MG tablet Take 650 mg by mouth every 6 (six) hours as needed for mild pain, moderate pain or headache.    . albuterol (PROVENTIL HFA;VENTOLIN HFA) 108 (90 BASE) MCG/ACT inhaler Inhale 2 puffs into the lungs every 6 (six) hours as needed for wheezing or shortness of breath.    Marland Kitchen albuterol (PROVENTIL) (2.5 MG/3ML) 0.083% nebulizer solution Take 2.5 mg by nebulization every 6 (six) hours as needed for wheezing or shortness of breath.     No current facility-administered medications for this visit.     PAST MEDICAL HISTORY: Past Medical History:  Diagnosis Date  . Anxiety   . Arthritis   . Asthma   . Cancer [facility]) 02-01-2009   Dr Arnoldo Morale, Stage I rectal cancer  . Depression   . GERD (gastroesophageal reflux disease)   . Hypertension     PAST SURGICAL HISTORY: Past Surgical History:  Procedure Laterality Date  . CATARACT EXTRACTION Left   . CATARACT EXTRACTION W/PHACO Right 05/12/2013   Procedure: CATARACT EXTRACTION PHACO AND INTRAOCULAR LENS PLACEMENT (IOC);  Surgeon: Tonny Branch, MD;  Location: [facility];  Service: Ophthalmology;  Laterality: Right;  CDE 25.23  . COLON RESECTION  02/01/2009  rectal cancer  . COLONOSCOPY N/A 09/16/2013   Procedure: COLONOSCOPY;  Surgeon: Danie Binder, MD;  Location: [facility];  Service: Endoscopy;  Laterality: N/A;  9:45-moved to 955  . ESOPHAGOGASTRODUODENOSCOPY  03/08/2009   [id number] esophagus, small hiatal hernia, otherwise normal  . Ileocolonoscopy  03/08/2009   [facility]:[id number] surgical scar present in rectal mucosa with polypoid lesion,overlying it as described above, status post a clean resection/Pan colonic diverticula, remainder of colonic mucosa appeared     FAMILY HISTORY: Family History  Problem Relation Age of Onset  . Colon cancer Neg Hx     SOCIAL HISTORY:  Social History   Social History  . Marital status: Widowed    Spouse name: N/A  . Number of children: 4  . Years of education: N/A   Occupational History  . Not on file.   Social History Main Topics  . Smoking status: Never Smoker  . Smokeless tobacco: Never Used  . Alcohol use No  . Drug use: No  . Sexual activity: Yes    Birth control/ protection: Post-menopausal   Other Topics Concern  . Not on file   Social History Narrative  . No narrative on file     PHYSICAL EXAM   Vitals:   05/31/17 1417  BP: (!) 150/74  Pulse: 62  Resp: 18  Weight: 145 lb (65.8 kg)  Height: 5\' 2"  (1.575 m)    Not recorded      Body mass index is 26.52 kg/m.  PHYSICAL EXAMNIATION:  Gen: NAD, conversant, well nourised, obese, well groomed                     Cardiovascular: Regular rate rhythm, no peripheral edema, warm, nontender. Eyes: Conjunctivae clear without exudates or hemorrhage Neck: Supple, no carotid bruits. Pulmonary: Clear to auscultation bilaterally   NEUROLOGICAL EXAM:  MMSE - Mini Mental State Exam 05/31/2017  Orientation to time 2  Orientation to Place 3  Registration 3  Attention/ Calculation 0  Recall 0  Language- name 2 objects 2  Language- repeat 1  Language- follow 3 step command 3  Language- read & follow direction 1  Write a sentence 1  Copy design 0  Total score 16  animal naming 11   CRANIAL NERVES: CN II: Visual fields are full to confrontation. Fundoscopic exam is normal with sharp discs and no vascular changes. Pupils are round equal and briskly reactive to light. CN III, IV, VI: extraocular movement are normal. No ptosis. CN V: Facial sensation is intact to pinprick in all 3 divisions bilaterally. Corneal responses are intact.  CN VII: Face is symmetric with normal eye closure and smile. CN VIII: Hearing is normal to  rubbing fingers CN IX, X: Palate elevates symmetrically. Phonation is normal. CN XI: Head turning and shoulder shrug are intact CN XII: Tongue is midline with normal movements and no atrophy.  MOTOR: There is no pronator drift of out-stretched arms. Muscle bulk and tone are normal. Muscle strength is normal.  REFLEXES: Reflexes are 2+ and symmetric at the biceps, triceps, knees, and ankles. Plantar responses are flexor.  SENSORY: Intact to light touch, pinprick, positional sensation and vibratory sensation are intact in fingers and toes.  COORDINATION: Rapid alternating movements and fine finger movements are intact. There is no dysmetria on finger-to-nose and heel-knee-shin.    GAIT/STANCE: Posture is normal. Gait is steady with normal steps, base, arm swing, and turning. Heel and toe walking are normal. Tandem gait is normal.  Romberg is absent.   DIAGNOSTIC DATA (LABS, IMAGING, TESTING) - I reviewed patient records, labs, notes, testing and imaging myself where available.   ASSESSMENT AND PLAN  ANURADHA CHABOT is a 83 y.o. female    Dementia  Mini-Mental Status Examination 16/30   Strong family history of dementia  Most consistent with central nervous system degenerative disorder  Complete laboratory evaluations with MRI of the brain  Laboratory evaluations  Starting Namenda 10 mg twice a day, Aricept 10 mg once a day  Marcial Pacas, M.D. Ph.D.  [facility] [address] [place], [place] [address] Ph: [phone] Fax: [phone]  CC: Ernestine Conrad, FNP

## 2017-06-01 ENCOUNTER — Telehealth: Payer: Self-pay | Admitting: Neurology

## 2017-06-01 LAB — FOLATE: FOLATE: 11 ng/mL (ref >3.0)

## 2017-06-01 LAB — ANA W/REFLEX IF POSITIVE: ANA: NEGATIVE

## 2017-06-01 LAB — VITAMIN D 25 HYDROXY (VIT D DEFICIENCY, FRACTURES): Vit D, 25-Hydroxy: 38.1 ng/mL (ref 30.0–100.0)

## 2017-06-01 LAB — THYROID PANEL WITH TSH
Free Thyroxine Index: 1.7 (ref 1.2–4.9)
T3 UPTAKE RATIO: 25 % (ref 24–39)
T4, Total: 6.8 ug/dL (ref 4.5–12.0)
TSH: 2.3 u[IU]/mL (ref 0.450–4.500)

## 2017-06-01 LAB — VITAMIN B12: Vitamin B-12: 233 pg/mL (ref 232–1245)

## 2017-06-01 LAB — C-REACTIVE PROTEIN: CRP: 2.8 mg/L (ref 0.0–4.9)

## 2017-06-01 LAB — HIV ANTIBODY (ROUTINE TESTING W REFLEX): HIV SCREEN 4TH GENERATION: NONREACTIVE

## 2017-06-01 LAB — RPR: RPR: NONREACTIVE

## 2017-06-01 LAB — SEDIMENTATION RATE: Sed Rate: 10 mm/hr (ref 0–40)

## 2017-06-01 NOTE — Telephone Encounter (Signed)
Spoke with patient's daughter, [name] Goodman, on [place] and informed her that patient's labs showed a  low normal range of Vitamin B12. Advised [name] she would benefit from over-the-counter vitamin B12 supplement, 1000 micrograms daily. [name] stated she wrote information down. She verbalized understanding, appreciation.

## 2017-06-01 NOTE — Telephone Encounter (Signed)
Please call patient, laboratory evaluation demonstrated low normal range vitamin B12, she would benefit over-the-counter vitamin B12 supplement, 1000 micrograms daily

## 2017-06-25 ENCOUNTER — Ambulatory Visit
Admission: RE | Admit: 2017-06-25 | Discharge: 2017-06-25 | Disposition: A | Discharge status: Home / Self Care | Payer: Medicare HMO | Source: Ambulatory Visit | Attending: Neurology | Admitting: Neurology

## 2017-06-25 DIAGNOSIS — R413 Other amnesia: Secondary | ICD-10-CM

## 2017-06-26 ENCOUNTER — Telehealth: Payer: Self-pay | Admitting: Neurology

## 2017-06-26 NOTE — Telephone Encounter (Signed)
Left patient's daughter on HIPAA a detailed message, with results, on her voicemail (ok per DPR).  Provided our number to call back with any questions.

## 2017-06-26 NOTE — Telephone Encounter (Signed)
Please call patient MRI of the brain showed age-related changes, I will review MRI films with her next follow-up visit   IMPRESSION:  This MRI of the brain without contrast shows the following: 1.   Moderate generalized cortical atrophy that is more pronounced in the mesial temporal lobes. 2.   Moderate chronic microvascular ischemic change. 3.   There are no acute findings.

## 2017-07-05 ENCOUNTER — Telehealth: Payer: Self-pay | Admitting: Neurology

## 2017-07-05 NOTE — Telephone Encounter (Signed)
Left message for a return call

## 2017-07-05 NOTE — Telephone Encounter (Signed)
Pt's daughter called in said since MRI results show age related changes does she really need to take memantine (NAMENDA) 10 MG tablet and donepezil (ARICEPT) 10 MG tablet . She is wanting to know if the benefits outweigh the risk and she seems to be more confused. Since seeing Dr Krista Blue the pt's favorite brother passed and she is not sure if this has something to do with her not feeling like herself. Please call to discuss.

## 2017-07-05 NOTE — Telephone Encounter (Signed)
Left another message for a return call

## 2017-07-06 NOTE — Telephone Encounter (Signed)
Left third message requesting a return call.  If patient's daughter calls back, we are happy to answer any questions.  If we do not hear back, Dr. Krista Blue can discuss further at her pending follow up appt.

## 2017-09-10 ENCOUNTER — Encounter: Payer: Self-pay | Admitting: Neurology

## 2017-09-10 ENCOUNTER — Ambulatory Visit: Payer: Medicare HMO | Admitting: Neurology

## 2017-09-10 VITALS — BP 164/76 | HR 65 | Ht 62.0 in | Wt 134.0 lb

## 2017-09-10 DIAGNOSIS — F0391 Unspecified dementia with behavioral disturbance: Secondary | ICD-10-CM | POA: Diagnosis not present

## 2017-09-10 DIAGNOSIS — F03918 Unspecified dementia, unspecified severity, with other behavioral disturbance: Secondary | ICD-10-CM | POA: Insufficient documentation

## 2017-09-10 NOTE — Progress Notes (Signed)
PATIENT: [name] Goodman DOB: 04-19-1932  Chief Complaint  Patient presents with  . Memory Loss    MMSE - animals.  She is here with her daughters, [name] Goodman and [name] Goodman.  They would like to review her MRI results.  She is still taking donepezil and memantine.  Her memory is unchanged.   She has lost 11 pounds since last seen due to GI upset.  States her favorite brother passed away in   . Patient Education     HISTORICAL  [name] Goodman is of 83 years old right-hand female, with her 2 daughters [name] Goodman, [name] Goodman, seen in refer by her primary care nurse practitioner [name] Goodman for evaluation of memory loss, initial evaluation was May 31 2017.  I reviewed and summarized the referring note,She had a past medical history of hypertension, hypothyroidism, on supplement, depression, history of stage I rectal cancer, was treated with surgery in 2010, no chemotherapy or radiation therapy is required, she presented with rectal bleeding at that time.  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  She now lives alone, independent of daily activities,family member check on her frequently,she complains of feeling anxious at evening time, hear people knock on the door, is afraid to open the door   Laboratory evaluation in September 2018:normal CMP with exception of mildly decreased creatinine 1.09,glucose 120, normal CBC,  UPDATE Sep 10 2017: Laboratory evaluation August 9518, normal ESR, folic acid, C-reactive protein, thyroid function test, vitamin D, ANA, RPR, low normal range vitamin B12, 233  I have personally reviewed MRI of brain September 2018, diffuse generalized atrophy mild supratentorium small  vessel disease  She has less appetite, she drinks ensure sometimes, she also complains some GI side effect, she is taking namenda 43m bid, aricept 16mqday, she get frustrated easily, makeup ideas sometimes, 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 which she has not done for many years  REVIEW OF SYSTEMS: Full 14 system review of systems performed and notable only for as above  ALLERGIES: No Known Allergies  HOME MEDICATIONS: Current Outpatient Medications  Medication Sig Dispense Refill  . acetaminophen (TYLENOL) 325 MG tablet Take 650 mg by mouth every 6 (six) hours as needed for mild pain, moderate pain or headache.    . albuterol (PROVENTIL HFA;VENTOLIN HFA) 108 (90 BASE) MCG/ACT inhaler Inhale 2 puffs into the lungs every 6 (six) hours as needed for wheezing or shortness of breath.    . Marland Kitchenlbuterol (PROVENTIL) (2.5 MG/3ML) 0.083% nebulizer solution Take 2.5 mg by nebulization every 6 (six) hours as needed for wheezing or shortness of breath.    . Marland KitchenmLODipine (NORVASC) 5 MG tablet Take 5 mg by mouth daily.    . Marland KitchenIOTIN PO Take 1 tablet by mouth daily.    . Marland Kitchenonepezil (ARICEPT) 10 MG tablet Take 1 tablet (10 mg total) by mouth at bedtime. 30 tablet 11  . levothyroxine (SYNTHROID, LEVOTHROID) 50 MCG tablet Take 1 tablet by mouth daily.  5  . memantine (NAMENDA) 10 MG tablet Take 1 tablet (10 mg total) by mouth 2 (two) times daily. 60 tablet 11  . omeprazole (PRILOSEC) 20 MG capsule Take 20 mg by mouth daily.    .Marland Kitchen  sertraline (ZOLOFT) 50 MG tablet Take 1 tablet by mouth daily.    . Vitamin D, Ergocalciferol, (DRISDOL) 50000 units CAPS capsule Take 50,000 Units by mouth every 7 (seven) days.     No current facility-administered medications for this visit.     PAST MEDICAL HISTORY: Past Medical History:  Diagnosis Date  . Anxiety   . Arthritis   . Asthma   . Cancer [facility]) 2010   Dr [name] Goodman, Stage I rectal cancer  . Depression   . GERD (gastroesophageal reflux disease)   .  Hypertension     PAST SURGICAL HISTORY: Past Surgical History:  Procedure Laterality Date  . CATARACT EXTRACTION Left   . CATARACT EXTRACTION W/PHACO Right 05/12/2013   Procedure: CATARACT EXTRACTION PHACO AND INTRAOCULAR LENS PLACEMENT (IOC);  Surgeon: [name] Branch, MD;  Location: [facility] OR[facility];  Service: Ophthalmology;  Laterality: Right;  CDE 25.23  . COLON RESECTION  2010   rectal cancer  . COLONOSCOPY N/A 09/16/2013   Procedure: COLONOSCOPY;  Surgeon: [name] Binder, MD;  Location: [facility] ENDO SU[facility];  Service: Endoscopy;  Laterality: N/A;  9:45-moved to 955  . ESOPHAGOGASTRODUODENOSCOPY  03/08/2009   VOP:FYTWKM esophagus, small hiatal hernia, otherwise normal  . Ileocolonoscopy  03/08/2009   QKM:MNOT surgical scar present in rectal mucosa with polypoid lesion,overlying it as described above, status post a clean resection/Pan colonic diverticula, remainder of colonic mucosa appeared    FAMILY HISTORY: Family History  Problem Relation Age of Onset  . Colon cancer Neg Hx     SOCIAL HISTORY:  Social History   Socioeconomic History  . Marital status: Widowed    Spouse name: Not on file  . Number of children: 4  . Years of education: Not on file  . Highest education level: Not on file  Social Needs  . Financial resource strain: Not on file  . Food insecurity - worry: Not on file  . Food insecurity - inability: Not on file  . Transportation needs - medical: Not on file  . Transportation needs - non-medical: Not on file  Occupational History  . Not on file  Tobacco Use  . Smoking status: Never Smoker  . Smokeless tobacco: Never Used  Substance and Sexual Activity  . Alcohol use: No  . Drug use: No  . Sexual activity: Yes    Birth control/protection: Post-menopausal  Other Topics Concern  . Not on file  Social History Narrative  . Not on file     PHYSICAL EXAM   Vitals:   09/10/17 1357  BP: (!) 164/76  Pulse: 65  Weight: 134 lb (60.8 kg)  Height: _0  (1.575 m)    Not  recorded      Body mass index is 24.51 kg/m.  PHYSICAL EXAMNIATION:  Gen: NAD, conversant, well nourised, obese, well groomed                     Cardiovascular: Regular rate rhythm, no peripheral edema, warm, nontender. Eyes: Conjunctivae clear without exudates or hemorrhage Neck: Supple, no carotid bruits. Pulmonary: Clear to auscultation bilaterally   NEUROLOGICAL EXAM:  MMSE - Mini Mental State Exam 09/10/2017 05/31/2017  Orientation to time 1 2  Orientation to Place 3 3  Registration 3 3  Attention/ Calculation 1 0  Recall 0 0  Language- name 2 objects 2 2  Language- repeat 1 1  Language- follow 3 step command 2 3  Language- read & follow direction 1 1  Write a sentence 1 1  Copy design 0 0  Total score 15 16   Animal naming 11 CRANIAL NERVES: CN II: Visual fields are full to confrontation. Fundoscopic exam is normal with sharp discs and no vascular changes. Pupils are round equal and briskly reactive to light. CN III, IV, VI: extraocular movement are normal. No ptosis. CN V: Facial sensation is intact to pinprick in all 3 divisions bilaterally. Corneal responses are intact.  CN VII: Face is symmetric with normal eye closure and smile. CN VIII: Hearing is normal to rubbing fingers CN IX, X: Palate elevates symmetrically. Phonation is normal. CN XI: Head turning and shoulder shrug are intact CN XII: Tongue is midline with normal movements and no atrophy.  MOTOR: There is no pronator drift of out-stretched arms. Muscle bulk and tone are normal. Muscle strength is normal.  REFLEXES: Reflexes are 2+ and symmetric at the biceps, triceps, knees, and ankles. Plantar responses are flexor.  SENSORY: Intact to light touch, pinprick, positional sensation and vibratory sensation are intact in fingers and toes.  COORDINATION: Rapid alternating movements and fine finger movements are intact. There is no dysmetria on finger-to-nose and heel-knee-shin.     GAIT/STANCE: Posture is normal. Gait is steady with normal steps, base, arm swing, and turning. Heel and toe walking are normal. Tandem gait is normal.  Romberg is absent.   DIAGNOSTIC DATA (LABS, IMAGING, TESTING) - I reviewed patient records, labs, notes, testing and imaging myself where available.   ASSESSMENT AND PLAN  [name] Goodman is a 83 y.o. female    Dementia with agitation  Mini-Mental Status Examination 15 /30   Strong family history of dementia  Most consistent with central nervous system degenerative disorder  Continue Namenda 10 mg twice a day, Aricept 10 mg once a day  Also provided information for dementia with agitation trials  Marcial Pacas, M.D. Ph.D.  [facility] [address] [place],  [address] Ph: [phone] Fax: [phone]  CC: Ernestine Conrad, FNP

## 2017-11-01 ENCOUNTER — Emergency Department (HOSPITAL_COMMUNITY): Payer: Medicare HMO

## 2017-11-01 ENCOUNTER — Observation Stay (HOSPITAL_COMMUNITY)
Admission: EM | Admit: 2017-11-01 | Discharge: 2017-11-03 | Disposition: A | Discharge status: Home / Self Care | Payer: Medicare HMO | Attending: Internal Medicine | Admitting: Internal Medicine

## 2017-11-01 ENCOUNTER — Other Ambulatory Visit: Payer: Self-pay

## 2017-11-01 ENCOUNTER — Encounter (HOSPITAL_COMMUNITY): Payer: Self-pay | Admitting: Emergency Medicine

## 2017-11-01 DIAGNOSIS — I10 Essential (primary) hypertension: Secondary | ICD-10-CM | POA: Insufficient documentation

## 2017-11-01 DIAGNOSIS — J45909 Unspecified asthma, uncomplicated: Secondary | ICD-10-CM | POA: Insufficient documentation

## 2017-11-01 DIAGNOSIS — R4182 Altered mental status, unspecified: Principal | ICD-10-CM | POA: Insufficient documentation

## 2017-11-01 DIAGNOSIS — F0390 Unspecified dementia without behavioral disturbance: Secondary | ICD-10-CM | POA: Diagnosis not present

## 2017-11-01 DIAGNOSIS — R55 Syncope and collapse: Secondary | ICD-10-CM | POA: Diagnosis not present

## 2017-11-01 DIAGNOSIS — E039 Hypothyroidism, unspecified: Secondary | ICD-10-CM

## 2017-11-01 DIAGNOSIS — F329 Major depressive disorder, single episode, unspecified: Secondary | ICD-10-CM | POA: Diagnosis not present

## 2017-11-01 DIAGNOSIS — Z85048 Personal history of other malignant neoplasm of rectum, rectosigmoid junction, and anus: Secondary | ICD-10-CM | POA: Insufficient documentation

## 2017-11-01 DIAGNOSIS — I951 Orthostatic hypotension: Secondary | ICD-10-CM | POA: Diagnosis not present

## 2017-11-01 DIAGNOSIS — Z79899 Other long term (current) drug therapy: Secondary | ICD-10-CM | POA: Diagnosis not present

## 2017-11-01 DIAGNOSIS — F32A Depression, unspecified: Secondary | ICD-10-CM

## 2017-11-01 DIAGNOSIS — R828 Abnormal findings on cytological and histological examination of urine: Secondary | ICD-10-CM | POA: Insufficient documentation

## 2017-11-01 DIAGNOSIS — R001 Bradycardia, unspecified: Secondary | ICD-10-CM | POA: Diagnosis not present

## 2017-11-01 DIAGNOSIS — G479 Sleep disorder, unspecified: Secondary | ICD-10-CM | POA: Diagnosis present

## 2017-11-01 HISTORY — DX: Unspecified dementia, unspecified severity, without behavioral disturbance, psychotic disturbance, mood disturbance, and anxiety: F03.90

## 2017-11-01 HISTORY — DX: Hypothyroidism, unspecified: E03.9

## 2017-11-01 LAB — RAPID URINE DRUG SCREEN, HOSP PERFORMED
Amphetamines: NOT DETECTED
BARBITURATES: NOT DETECTED
Benzodiazepines: NOT DETECTED
Cocaine: NOT DETECTED
OPIATES: NOT DETECTED
TETRAHYDROCANNABINOL: NOT DETECTED

## 2017-11-01 LAB — TROPONIN I: Troponin I: <0.03 ng/mL (ref <0.03)

## 2017-11-01 LAB — CBC WITH DIFFERENTIAL/PLATELET
Basophils Absolute: 0 10*3/uL (ref 0.0–0.1)
Basophils Relative: 0 %
Eosinophils Absolute: 0.2 10*3/uL (ref 0.0–0.7)
Eosinophils Relative: 3 %
HEMATOCRIT: 42.3 % (ref 36.0–46.0)
Hemoglobin: 13.5 g/dL (ref 12.0–15.0)
LYMPHS ABS: 1.9 10*3/uL (ref 0.7–4.0)
LYMPHS PCT: 34 %
MCH: 28.2 pg (ref 26.0–34.0)
MCHC: 31.9 g/dL (ref 30.0–36.0)
MCV: 88.5 fL (ref 78.0–100.0)
MONOS PCT: 5 %
Monocytes Absolute: 0.3 10*3/uL (ref 0.1–1.0)
NEUTROS ABS: 3.2 10*3/uL (ref 1.7–7.7)
NEUTROS PCT: 58 %
Platelets: 206 10*3/uL (ref 150–400)
RBC: 4.78 MIL/uL (ref 3.87–5.11)
RDW: 13.8 % (ref 11.5–15.5)
WBC: 5.6 10*3/uL (ref 4.0–10.5)

## 2017-11-01 LAB — COMPREHENSIVE METABOLIC PANEL
ALBUMIN: 4.1 g/dL (ref 3.5–5.0)
ALT: 9 U/L — ABNORMAL LOW (ref 14–54)
AST: 22 U/L (ref 15–41)
Alkaline Phosphatase: 114 U/L (ref 38–126)
Anion gap: 12 (ref 5–15)
BILIRUBIN TOTAL: 0.4 mg/dL (ref 0.3–1.2)
BUN: 17 mg/dL (ref 6–20)
CHLORIDE: 104 mmol/L (ref 101–111)
CO2: 24 mmol/L (ref 22–32)
Calcium: 10 mg/dL (ref 8.9–10.3)
Creatinine, Ser: 0.87 mg/dL (ref 0.44–1.00)
GFR calc Af Amer: >60 mL/min (ref >60)
GFR calc non Af Amer: 60 mL/min — ABNORMAL LOW (ref >60)
GLUCOSE: 101 mg/dL — AB (ref 65–99)
POTASSIUM: 4 mmol/L (ref 3.5–5.1)
SODIUM: 140 mmol/L (ref 135–145)
Total Protein: 7.6 g/dL (ref 6.5–8.1)

## 2017-11-01 LAB — URINALYSIS, ROUTINE W REFLEX MICROSCOPIC
BILIRUBIN URINE: NEGATIVE
Glucose, UA: NEGATIVE mg/dL
Ketones, ur: NEGATIVE mg/dL
NITRITE: NEGATIVE
Protein, ur: NEGATIVE mg/dL
SPECIFIC GRAVITY, URINE: 1.008 (ref 1.005–1.030)
pH: 6 (ref 5.0–8.0)

## 2017-11-01 LAB — ETHANOL: Alcohol, Ethyl (B): <10 mg/dL (ref <10)

## 2017-11-01 MED ORDER — LEVOTHYROXINE SODIUM 50 MCG PO TABS
50.0000 ug | ORAL_TABLET | Freq: Every day | ORAL | Status: DC
  Administered 2017-11-02 – 2017-11-03 (×2): 50 ug via ORAL
  Filled 2017-11-01 (×2): qty 1

## 2017-11-01 MED ORDER — ACETAMINOPHEN 650 MG RE SUPP: 650.0000 mg | Freq: Four times a day (QID) | RECTAL | Status: DC | PRN

## 2017-11-01 MED ORDER — SODIUM CHLORIDE 0.9 % IV SOLN
INTRAVENOUS | Status: AC
  Administered 2017-11-01: 21:00:00 via INTRAVENOUS

## 2017-11-01 MED ORDER — PANTOPRAZOLE SODIUM 40 MG PO TBEC
40.0000 mg | DELAYED_RELEASE_TABLET | Freq: Every day | ORAL | Status: DC
  Administered 2017-11-01 – 2017-11-03 (×3): 40 mg via ORAL
  Filled 2017-11-01 (×3): qty 1

## 2017-11-01 MED ORDER — SERTRALINE HCL 50 MG PO TABS
50.0000 mg | ORAL_TABLET | Freq: Every day | ORAL | Status: DC
  Administered 2017-11-01 – 2017-11-03 (×3): 50 mg via ORAL
  Filled 2017-11-01 (×3): qty 1

## 2017-11-01 MED ORDER — SODIUM CHLORIDE 0.9% FLUSH
3.0000 mL | Freq: Two times a day (BID) | INTRAVENOUS | Status: DC
  Administered 2017-11-01 – 2017-11-03 (×4): 3 mL via INTRAVENOUS

## 2017-11-01 MED ORDER — ACETAMINOPHEN 325 MG PO TABS: 650.0000 mg | ORAL_TABLET | Freq: Four times a day (QID) | ORAL | Status: DC | PRN

## 2017-11-01 MED ORDER — MEMANTINE HCL 10 MG PO TABS
10.0000 mg | ORAL_TABLET | Freq: Two times a day (BID) | ORAL | Status: DC
  Administered 2017-11-01 – 2017-11-03 (×4): 10 mg via ORAL
  Filled 2017-11-01 (×4): qty 1

## 2017-11-01 MED ORDER — AMLODIPINE BESYLATE 5 MG PO TABS
5.0000 mg | ORAL_TABLET | Freq: Every day | ORAL | Status: DC
  Administered 2017-11-01 – 2017-11-03 (×3): 5 mg via ORAL
  Filled 2017-11-01 (×3): qty 1

## 2017-11-01 MED ORDER — DONEPEZIL HCL 5 MG PO TABS
10.0000 mg | ORAL_TABLET | Freq: Every day | ORAL | Status: DC
  Administered 2017-11-01 – 2017-11-02 (×2): 10 mg via ORAL
  Filled 2017-11-01 (×2): qty 2

## 2017-11-01 NOTE — ED Provider Notes (Signed)
[facility] EMERGENCY DEPARTMENT Provider Note   CSN: [id number] Arrival date & time: 11/01/17  1254     History   Chief Complaint Chief Complaint  Patient presents with  . Sleeping Problem    HPI [name] Goodman is a 84 y.o. female.  The history is provided by the patient, the EMS personnel and a relative. The history is limited by the condition of the patient (Hx dementia).  Pt was seen at 1305. Per EMS, family report at scene and pt: Pt states she has been depressed and not sleeping well after the death of her favorite brother several months ago. Pt was taking a nap in her recliner. Family states they tried "banging on the door" to wake her up and then used their key to get in the house. Pt's family states pt was "still not responding" and "breathing shallowly."  Family pulled her to the floor and started CPR. Family stopped CPR because "she was breathing fine." EMS arrived and pt was awake/alert and at baseline. Pt states her family "just didn't give her enough time to respond to them." Family states "there was plenty of time" to respond to the loud noise they were making.  Pt states she is "just depressed" and "having a nervous breakdown" over the death of her brother.  Denies SA/HI, no CP/SOB, no abd pain, no N/V/D, no neck or back pain.     Past Medical History:  Diagnosis Date  . Anxiety   . Arthritis   . Asthma   . Cancer [facility]) 2010   Dr Arnoldo Morale, Stage I rectal cancer  . Dementia   . Depression   . GERD (gastroesophageal reflux disease)   . Hypertension     Patient Active Problem List   Diagnosis Date Noted  . Dementia with behavioral disturbance 09/10/2017  . Dementia 05/31/2017  . GASTROINTESTINAL HEMORRHAGE, HX OF 04/28/2009  . HYPERTENSION 04/26/2009  . ARTHRITIS 04/26/2009  . Malignant neoplasm of rectum ([facility]) 11/10/2008    Past Surgical History:  Procedure Laterality Date  . CATARACT EXTRACTION Left   . CATARACT EXTRACTION W/PHACO Right 05/12/2013   Procedure: CATARACT EXTRACTION PHACO AND INTRAOCULAR LENS PLACEMENT (IOC);  Surgeon: Tonny Branch, MD;  Location: [facility];  Service: Ophthalmology;  Laterality: Right;  CDE 25.23  . COLON RESECTION  2010   rectal cancer  . COLONOSCOPY N/A 09/16/2013   Procedure: COLONOSCOPY;  Surgeon: Danie Binder, MD;  Location: [facility];  Service: Endoscopy;  Laterality: N/A;  9:45-moved to 955  . ESOPHAGOGASTRODUODENOSCOPY  03/08/2009   IEP:PIRJJO esophagus, small hiatal hernia, otherwise normal  . Ileocolonoscopy  03/08/2009   ACZ:YSAY surgical scar present in rectal mucosa with polypoid lesion,overlying it as described above, status post a clean resection/Pan colonic diverticula, remainder of colonic mucosa appeared    OB History    No data available       Home Medications    Prior to Admission medications   Medication Sig Start Date End Date Taking? Authorizing Provider  acetaminophen (TYLENOL) 325 MG tablet Take 650 mg by mouth every 6 (six) hours as needed for mild pain, moderate pain or headache.    [provider]  albuterol (PROVENTIL HFA;VENTOLIN HFA) 108 (90 BASE) MCG/ACT inhaler Inhale 2 puffs into the lungs every 6 (six) hours as needed for wheezing or shortness of breath.    [provider]  albuterol (PROVENTIL) (2.5 MG/3ML) 0.083% nebulizer solution Take 2.5 mg by nebulization every 6 (six) hours as needed for wheezing  or shortness of breath.    [provider]  amLODipine (NORVASC) 5 MG tablet Take 5 mg by mouth daily.    [provider]  BIOTIN PO Take 1 tablet by mouth daily.    [provider]  donepezil (ARICEPT) 10 MG tablet Take 1 tablet (10 mg total) by mouth at bedtime. 05/31/17   Marcial Pacas, MD  levothyroxine (SYNTHROID, LEVOTHROID) 50 MCG tablet Take 1 tablet by mouth daily. 03/21/17   [provider]  memantine (NAMENDA) 10 MG tablet Take 1 tablet (10 mg total) by mouth 2 (two) times daily. 05/31/17   Marcial Pacas, MD    omeprazole (PRILOSEC) 20 MG capsule Take 20 mg by mouth daily.    [provider]  sertraline (ZOLOFT) 50 MG tablet Take 1 tablet by mouth daily. 05/10/16   [provider]  Vitamin D, Ergocalciferol, (DRISDOL) 50000 units CAPS capsule Take 50,000 Units by mouth every 7 (seven) days.    [provider]    Family History Family History  Problem Relation Age of Onset  . Colon cancer Neg Hx     Social History Social History   Tobacco Use  . Smoking status: Never Smoker  . Smokeless tobacco: Never Used  Substance Use Topics  . Alcohol use: No  . Drug use: No     Allergies   Patient has no known allergies.   Review of Systems Review of Systems  Unable to perform ROS: Dementia     Physical Exam Updated Vital Signs BP (!) 193/66 (BP Location: Right Arm)   Pulse (!) 52   Temp 97.8 F (36.6 C) (Oral)   Resp 18   Ht 5\' 8"  (1.727 m)   Wt 59 kg (130 lb)   SpO2 100%   BMI 19.77 kg/m    15:23 Orthostatic Vital Signs HV  Orthostatic Lying   BP- Lying: 181/64  Pulse- Lying: 54      Orthostatic Sitting  BP- Sitting: 160/68  Pulse- Sitting: 55      Orthostatic Standing at 0 minutes  BP- Standing at 0 minutes: 152/81  Pulse- Standing at 0 minutes: 59     Physical Exam 1310: Physical examination:  Nursing notes reviewed; Vital signs and O2 SAT reviewed;  Constitutional: Well developed, Well nourished, Well hydrated, In no acute distress; Head:  Normocephalic, atraumatic; Eyes: EOMI, PERRL, No scleral icterus; ENMT: Mouth and pharynx normal, Mucous membranes moist; Neck: Supple, Full range of motion, No lymphadenopathy; Cardiovascular: Regular rate and rhythm, No gallop; Respiratory: Breath sounds clear & equal bilaterally, No wheezes.  Speaking full sentences with ease, Normal respiratory effort/excursion; Chest: Nontender, no deformity, no abrasions or ecchymosis. Movement normal; Abdomen: Soft, Nontender, Nondistended, Normal bowel sounds;  Genitourinary: No CVA tenderness; Spine:  No midline CS, TS, LS tenderness.;; Extremities: Pulses normal, No tenderness, No edema, No calf edema or asymmetry.; Neuro: Awake, alert, recalls events PTA. No facial droop. Major CN grossly intact.  Speech clear. No gross focal motor or sensory deficits in extremities.; Skin: Color normal, Warm, Dry.; Psych:  Affect flat, very soft spoken.    ED Treatments / Results  Labs (all labs ordered are listed, but only abnormal results are displayed)   EKG  EKG Interpretation  Date/Time:  Thursday November 01 2017 15:32:29 EST Ventricular Rate:  51 PR Interval:    QRS Duration: 92 QT Interval:  449 QTC Calculation: 414 R Axis:   39 Text Interpretation:  Sinus rhythm Atrial premature complex Low voltage, precordial leads  Baseline wander When compared with ECG of 04/15/2017 No significant change was found Confirmed by Francine Graven [phone]) on 11/01/2017 4:28:25 PM       Radiology   Procedures Procedures (including critical care time)  Medications Ordered in ED Medications - No data to display   Initial Impression / Assessment and Plan / ED Course  I have reviewed the triage vital signs and the nursing notes.  Pertinent labs & imaging results that were available during my care of the patient were reviewed by me and considered in my medical decision making (see chart for details).  MDM Reviewed: previous chart, nursing note and vitals Reviewed previous: labs and ECG Interpretation: labs, ECG and x-ray   Results for orders placed or performed during the hospital encounter of 11/01/17  Urinalysis, Routine w reflex microscopic  Result Value Ref Range   Color, Urine YELLOW YELLOW   APPearance HAZY (A) CLEAR   Specific Gravity, Urine 1.008 1.005 - 1.030   pH 6.0 5.0 - 8.0   Glucose, UA NEGATIVE NEGATIVE mg/dL   Hgb urine dipstick MODERATE (A) NEGATIVE   Bilirubin Urine NEGATIVE NEGATIVE   Ketones, ur NEGATIVE NEGATIVE mg/dL   Protein,  ur NEGATIVE NEGATIVE mg/dL   Nitrite NEGATIVE NEGATIVE   Leukocytes, UA MODERATE (A) NEGATIVE   RBC / HPF 6-30 0 - 5 RBC/hpf   WBC, UA 6-30 0 - 5 WBC/hpf   Bacteria, UA RARE (A) NONE SEEN   Squamous Epithelial / LPF 0-5 (A) NONE SEEN  Urine rapid drug screen (hosp performed)  Result Value Ref Range   Opiates NONE DETECTED NONE DETECTED   Cocaine NONE DETECTED NONE DETECTED   Benzodiazepines NONE DETECTED NONE DETECTED   Amphetamines NONE DETECTED NONE DETECTED   Tetrahydrocannabinol NONE DETECTED NONE DETECTED   Barbiturates NONE DETECTED NONE DETECTED  Comprehensive metabolic panel  Result Value Ref Range   Sodium 140 135 - 145 mmol/L   Potassium 4.0 3.5 - 5.1 mmol/L   Chloride 104 101 - 111 mmol/L   CO2 24 22 - 32 mmol/L   Glucose, Bld 101 (H) 65 - 99 mg/dL   BUN 17 6 - 20 mg/dL   Creatinine, Ser 0.87 0.44 - 1.00 mg/dL   Calcium 10.0 8.9 - 10.3 mg/dL   Total Protein 7.6 6.5 - 8.1 g/dL   Albumin 4.1 3.5 - 5.0 g/dL   AST 22 15 - 41 U/L   ALT 9 (L) 14 - 54 U/L   Alkaline Phosphatase 114 38 - 126 U/L   Total Bilirubin 0.4 0.3 - 1.2 mg/dL   GFR calc non Af Amer 60 (L) >60 mL/min   GFR calc Af Amer >60 >60 mL/min   Anion gap 12 5 - 15  Ethanol  Result Value Ref Range   Alcohol, Ethyl (B) <10 <10 mg/dL  Troponin I  Result Value Ref Range   Troponin I <0.03 <0.03 ng/mL  CBC with Differential  Result Value Ref Range   WBC 5.6 4.0 - 10.5 K/uL   RBC 4.78 3.87 - 5.11 MIL/uL   Hemoglobin 13.5 12.0 - 15.0 g/dL   HCT 42.3 36.0 - 46.0 %   MCV 88.5 78.0 - 100.0 fL   MCH 28.2 26.0 - 34.0 pg   MCHC 31.9 30.0 - 36.0 g/dL   RDW 13.8 11.5 - 15.5 %   Platelets 206 150 - 400 K/uL   Neutrophils Relative % 58 %   Neutro Abs 3.2 1.7 - 7.7 K/uL   Lymphocytes Relative 34 %  Lymphs Abs 1.9 0.7 - 4.0 K/uL   Monocytes Relative 5 %   Monocytes Absolute 0.3 0.1 - 1.0 K/uL   Eosinophils Relative 3 %   Eosinophils Absolute 0.2 0.0 - 0.7 K/uL   Basophils Relative 0 %   Basophils Absolute  0.0 0.0 - 0.1 K/uL   Dg Chest 2 View Result Date: 11/01/2017 CLINICAL DATA:  Depression, hypersomnolence. History of asthma, dementia, rectal malignancy stage EXAM: CHEST  2 VIEW COMPARISON:  Chest x-ray of October 10, 2011 FINDINGS: The lungs are well-expanded and clear. The heart and pulmonary vascularity are normal. The mediastinum is normal in width. There is no pleural effusion. There is calcification in the wall of the thoracic aorta. The bony thorax is unremarkable. IMPRESSION: There is no active cardiopulmonary disease. Thoracic aortic atherosclerosis. Electronically Signed   By: David  Martinique M.D.   On: 11/01/2017 14:27   Ct Head Wo Contrast Result Date: 11/01/2017 CLINICAL DATA:  Near syncope EXAM: CT HEAD WITHOUT CONTRAST TECHNIQUE: Contiguous axial images were obtained from the base of the skull through the vertex without intravenous contrast. COMPARISON:  Brain MRI 06/25/2017, CT brain 04/15/2017 FINDINGS: Brain: No acute territorial infarction, hemorrhage or intracranial mass is visualized. Mild small vessel ischemic changes of the white matter. Moderate atrophy. Stable ventricle size. Vascular: No hyperdense vessels.  Carotid artery calcification. Skull: Normal. Negative for fracture or focal lesion. Sinuses/Orbits: No acute finding. Other: None IMPRESSION: 1. No CT evidence for acute intracranial abnormality. 2. Atrophy and small vessel ischemic changes of the white matter. Electronically Signed   By: Donavan Foil M.D.   On: 11/01/2017 14:50    1655:  Possible UTI, UC is pending. Dx and testing d/w pt and family.  Questions answered.  Verb understanding, agreeable to observation admit. T/C to [facility] Dr. Sarajane Jews, case discussed, including:  HPI, pertinent PM/SHx, VS/PE, dx testing, ED course and treatment:  Agreeable to admit.     Final Clinical Impressions(s) / ED Diagnoses   Final diagnoses:  None    ED Discharge Orders    None        Francine Graven, DO 11/04/17  1325

## 2017-11-01 NOTE — H&P (Signed)
History and Physical  [name] Goodman WIO:[id number] DOB: Jan 17, 1932 DOA: 11/01/2017  PCP: Ernestine Conrad, FNP  Patient coming from: home  Chief Complaint: unresponsive  HPI:  57yow PMH dementia, lives alone, presented with h/o unresponsiveness at home. Workup unrevealing, referred for observation for possible syncope.  History per patient unrevealing given dementia; patient denies complaints and ROS is negative. She does not remember events described by family/  2 sisters and granddaughter at bedside. Sister spoke to patient this AM, pt reported feeling poorly. Later granddaughter went to check on patient. Patient did not answer the door. Granddaughter opened door, found patient in recliner, reports patient was "unresponsive" and "would not wake up". After trying to rouse her, granddaughter called 911, was instructed to lay pt on floor and begin CPR, however granddaughter reports patient was breathing and she could see the carotid pulsating. Patient did not become full awake until arrival to to ED. Patient lives alone and has been managing well at home per family.  ED Course: unremarkable, with normal exam and labs. EDP concerned about syncope.  Review of Systems:  Unreliable secondary to dementia but Negative for fever, visual changes, sore throat, rash, new muscle aches, chest pain, SOB, dysuria, bleeding, n/v/abdominal pain.  Past Medical History:  Diagnosis Date  . Anxiety   . Arthritis   . Asthma   . Cancer [facility]) 2010   Dr Arnoldo Morale, Stage I rectal cancer  . Dementia   . Depression   . GERD (gastroesophageal reflux disease)   . Hypertension   . Hypothyroidism     Past Surgical History:  Procedure Laterality Date  . CATARACT EXTRACTION Left   . CATARACT EXTRACTION W/PHACO Right 05/12/2013   Procedure: CATARACT EXTRACTION PHACO AND INTRAOCULAR LENS PLACEMENT (IOC);  Surgeon: Tonny Branch, MD;  Location: [facility] ORS;  Service: Ophthalmology;  Laterality: Right;  CDE 25.23  .  COLON RESECTION  2010   rectal cancer  . COLONOSCOPY N/A 09/16/2013   Procedure: COLONOSCOPY;  Surgeon: Danie Binder, MD;  Location: [facility] ENDO SUITE;  Service: Endoscopy;  Laterality: N/A;  9:45-moved to 955  . ESOPHAGOGASTRODUODENOSCOPY  03/08/2009   EQA:STMHDQ esophagus, small hiatal hernia, otherwise normal  . Ileocolonoscopy  03/08/2009   QIW:LNLG surgical scar present in rectal mucosa with polypoid lesion,overlying it as described above, status post a clean resection/Pan colonic diverticula, remainder of colonic mucosa appeared     reports that  has never smoked. she has never used smokeless tobacco. She reports that she does not drink alcohol or use drugs. Mobility: ambulatory, no recent falls  No Known Allergies  Family History  Problem Relation Age of Onset  . Dementia Mother   . Dementia Father   . Diabetes Sister   . Colon cancer Neg Hx      Prior to Admission medications   Medication Sig Start Date End Date Taking? Authorizing Provider  acetaminophen (TYLENOL) 325 MG tablet Take 650 mg by mouth every 6 (six) hours as needed for mild pain, moderate pain or headache.   Yes [provider]  albuterol (PROVENTIL HFA;VENTOLIN HFA) 108 (90 BASE) MCG/ACT inhaler Inhale 2 puffs into the lungs every 6 (six) hours as needed for wheezing or shortness of breath.   Yes [provider]  albuterol (PROVENTIL) (2.5 MG/3ML) 0.083% nebulizer solution Take 2.5 mg by nebulization every 6 (six) hours as needed for wheezing or shortness of breath.   Yes [provider]  amLODipine (NORVASC) 5 MG tablet Take 5 mg by mouth daily.  Yes [provider]  donepezil (ARICEPT) 10 MG tablet Take 1 tablet (10 mg total) by mouth at bedtime. 05/31/17  Yes Marcial Pacas, MD  levothyroxine (SYNTHROID, LEVOTHROID) 50 MCG tablet Take 1 tablet by mouth daily. 03/21/17  Yes [provider]  memantine (NAMENDA) 10 MG tablet Take 1 tablet (10 mg total) by mouth 2 (two) times  daily. 05/31/17  Yes Marcial Pacas, MD  omeprazole (PRILOSEC) 20 MG capsule Take 20 mg by mouth daily.   Yes [provider]  sertraline (ZOLOFT) 50 MG tablet Take 1 tablet by mouth daily. 05/10/16  Yes [provider]  Vitamin D, Ergocalciferol, (DRISDOL) 50000 units CAPS capsule Take 50,000 Units by mouth once a week. Saturdays   Yes [provider]    Physical Exam:  Constitutional:   Appears calm and comfortable Eyes:  pupils and irises appear normal Normal lids  ENMT:  grossly normal hearing  Lips appear normal Neck:  neck appears normal, no masses no thyromegaly Respiratory:  CTA bilaterally, no w/r/r.  Respiratory effort normal. N Cardiovascular:  RRR, no m/r/g No LE extremity edema   Abdomen:  Abdomen appears normal; no tenderness or masses No hepatomegaly noted Musculoskeletal:  Digits/nails BUE: no clubbing, cyanosis, petechiae, infection RUE, LUE, RLE, LLE   strength and tone normal, no atrophy, no abnormal movements No tenderness, masses Sits up in bed without difficulty Skin:  No rashes, lesions, ulcers palpation of skin: no induration or nodules Psychiatric:  Mental status Mood, affect appropriate  Wt Readings from Last 3 Encounters:  11/01/17 59.9 kg (132 lb)  09/10/17 60.8 kg (134 lb)  05/31/17 65.8 kg (145 lb)    I have personally reviewed following labs and imaging studies  Labs:   CMP, troponin, CBC unremarkable  U/a equivocal  Imaging studies:   CT head NAD  CXR independently reviewed, NAD  Medical tests:   EKG independently reviewed: SB, no acute changes     Active Problems:   Syncope   Assessment/Plan Period of subjective unresponsiveness at home, no syncope. No history of resp or cardiac arrest per granddaughter on scene. Pt completely asymptomatic, EKG nonacute, labs unremarkable, no focal deficits. No evidence to suggest infection, cardiac or neurologic event - etiology and significant unclear but  was orthostatic by BP and this would explain symptoms as pt was sitting at the time, given advanced age and family's report, will observe overnight, monitor on telemetry. - check orthostatics in AM, echo, if no arrhythmias or new issues, likely home tomorrow.  Dementia, no behavioral disturbance - stable, continue Namenda, Aricept. Aricept can cause syncope and arrhythmias but see no indication to stop at this point given vague history and orthostasis  Hypothyroidism - check TSH, continue levothyroxine  Depression - continue Zoloft, family pursuing outpt grief counseling   Severity of Illness: The appropriate patient status for this patient is OBSERVATION. Observation status is judged to be reasonable and necessary in order to provide the required intensity of service to ensure the patient's safety. The patient's presenting symptoms, physical exam findings, and initial radiographic and laboratory data in the context of their medical condition is felt to place them at decreased risk for further clinical deterioration. Furthermore, it is anticipated that the patient will be medically stable for discharge from the hospital within 2 midnights of admission. The following factors support the patient status of observation.   " The patient's presenting symptoms include unresponsiveness.   DVT prophylaxis:SCDs Code Status: full confirmed Family Communication: 2 sisters, granddaugther Secondary school teacher  called:     Time spent: 60 minutes  Murray Hodgkins, MD  [facility] Direct contact: [phone] --[place]  --[email or web address]; password [email or web address]  7PM-7AM contact night coverage as above  11/01/2017, 6:50 PM

## 2017-11-01 NOTE — ED Triage Notes (Signed)
Pt was taking a nap in her recliner and her family tried to wake her up and they could not.  Pulled her down in the floor and began CPR because they thought she was deceased.  Pt then woke up alert and oriented.  Pt states she was awake the whole time, but they would not give her time to respond.  Has been battling depression since death of her brother and has not been sleeping well.

## 2017-11-02 ENCOUNTER — Observation Stay (HOSPITAL_BASED_OUTPATIENT_CLINIC_OR_DEPARTMENT_OTHER): Payer: Medicare HMO

## 2017-11-02 ENCOUNTER — Observation Stay (HOSPITAL_COMMUNITY)
Admit: 2017-11-02 | Discharge: 2017-11-02 | Disposition: A | Discharge status: Home / Self Care | Payer: Medicare HMO | Attending: Internal Medicine | Admitting: Internal Medicine

## 2017-11-02 DIAGNOSIS — I350 Nonrheumatic aortic (valve) stenosis: Secondary | ICD-10-CM

## 2017-11-02 DIAGNOSIS — E038 Other specified hypothyroidism: Secondary | ICD-10-CM | POA: Diagnosis not present

## 2017-11-02 DIAGNOSIS — I951 Orthostatic hypotension: Secondary | ICD-10-CM | POA: Diagnosis not present

## 2017-11-02 DIAGNOSIS — R55 Syncope and collapse: Secondary | ICD-10-CM | POA: Diagnosis not present

## 2017-11-02 DIAGNOSIS — F0390 Unspecified dementia without behavioral disturbance: Secondary | ICD-10-CM | POA: Diagnosis not present

## 2017-11-02 DIAGNOSIS — F0391 Unspecified dementia with behavioral disturbance: Secondary | ICD-10-CM

## 2017-11-02 LAB — TROPONIN I

## 2017-11-02 LAB — VITAMIN B12: Vitamin B-12: 150 pg/mL — ABNORMAL LOW (ref 180–914)

## 2017-11-02 LAB — ECHOCARDIOGRAM COMPLETE
HEIGHTINCHES: 62 in
Weight: 2160 oz

## 2017-11-02 LAB — AMMONIA: Ammonia: 31 umol/L (ref 9–35)

## 2017-11-02 LAB — TSH: TSH: 3.559 u[IU]/mL (ref 0.350–4.500)

## 2017-11-02 LAB — GLUCOSE, CAPILLARY
GLUCOSE-CAPILLARY: 92 mg/dL (ref 65–99)
Glucose-Capillary: 88 mg/dL (ref 65–99)

## 2017-11-02 MED ORDER — DEXTROSE 5 % IV SOLN
1.0000 g | INTRAVENOUS | Status: DC
  Administered 2017-11-02 – 2017-11-03 (×2): 1 g via INTRAVENOUS
  Filled 2017-11-02 (×4): qty 10

## 2017-11-02 NOTE — Care Management Obs Status (Signed)
[place] NOTIFICATION   Patient Details  Name: [name] Goodman MRN: [id number] Date of Birth: 06-Sep-1932   Medicare Observation Status Notification Given:  Yes    Sherald Barge, RN 11/02/2017, 2:50 PM

## 2017-11-02 NOTE — Progress Notes (Signed)
No VTE prophylaxis ordered for pt, MD aware.

## 2017-11-02 NOTE — Progress Notes (Signed)
EEG Completed; Results Pending  

## 2017-11-02 NOTE — Progress Notes (Signed)
PROGRESS NOTE  [name] Goodman NWG:[id number] DOB: 06/08/1932 DOA: 11/01/2017 PCP: Ernestine Conrad, FNP  Brief History:  84 year old female with a history of dementia, hypertension, rectal cancer, hypothyroidism presenting with an episode of unresponsiveness.  The patient stated that she was trying to get up out of her recliner on 11/01/2017.  She had difficulties getting up and was feeling dizzy.  As result, the patient stated that she sat back down and took a nap.  Apparently, her granddaughter went to check up on her later in the afternoon and stated the patient "would not wake up".  As a result, EMS was activated.  Granddaughter reported that the patient was breathing and could see carotid pulsating.  After arrival to the emergency department, the patient became fully awake.  The patient was afebrile hemodynamically stable but hypertensive with blood pressure 193/66.  Assessment/Plan: Unresponsiveness/altered mental status -Question whether the patient had syncope -Patient did have orthostatic hypotension with symptoms of dizziness -EEG -Ammonia 31 -TSH 3.559 -05/31/2017 serum B12 233 -CT brain negative -Monitor on telemetry -am BMP -personally reviewed CXR--no edema or infiltrates  Sinus bradycardia -Telemetry reviewed -HR remains in the 50s--doubt this contributed to the patient's episode of unresponsiveness -Personally reviewed EKG--sinus bradycardia without any AV blocks -Secondary to Aricept  Orthostatic hypotension -Continue IV fluids -Repeat orthostatics  Pyuria -Start ceftriaxone pending urine culture data  Hypothyroidism -Continue Synthroid -TSH 3.559  Dementia with behavioral disturbance -Continue Aricept and Namenda -09/10/2017 [facility]SE--15/30  Essential hypertension -Continue amlodipine     Disposition Plan:   Home 11/03/17 if stable Family Communication:  No Family at bedside  Consultants:  none  Code Status:  FULL  DVT Prophylaxis:   SCDs   Procedures: As Listed in Progress Note Above  Antibiotics: None    Subjective:   Objective: Vitals:   11/01/17 2131 11/02/17 0125 11/02/17 0500 11/02/17 0521  BP:  (!) 146/57  (!) 137/56  Pulse:  (!) 53  (!) 54  Resp:  16  16  Temp:  98.6 F (37 C)  98.6 F (37 C)  TempSrc:  Oral  Oral  SpO2: 100% 100%  97%  Weight:   61.2 kg (135 lb)   Height:        Intake/Output Summary (Last 24 hours) at 11/02/2017 0741 Last data filed at 11/02/2017 0300 Gross per 24 hour  Intake 666.25 ml  Output -  Net 666.25 ml   Weight change:  Exam:   General:  Pt is alert, follows commands appropriately, not in acute distress  HEENT: No icterus, No thrush, No neck mass, [place]/AT  Cardiovascular: RRR, S1/S2, no rubs, no gallops  Respiratory: CTA bilaterally, no wheezing, no crackles, no rhonchi  Abdomen: Soft/+BS, non tender, non distended, no guarding  Extremities: No edema, No lymphangitis, No petechiae, No rashes, no synovitis   Data Reviewed: I have personally reviewed following labs and imaging studies Basic Metabolic Panel: Recent Labs  Lab 11/01/17 1352  NA 140  K 4.0  CL 104  CO2 24  GLUCOSE 101*  BUN 17  CREATININE 0.87  CALCIUM 10.0   Liver Function Tests: Recent Labs  Lab 11/01/17 1352  AST 22  ALT 9*  ALKPHOS 114  BILITOT 0.4  PROT 7.6  ALBUMIN 4.1   No results for input(s): LIPASE, AMYLASE in the last 168 hours. Recent Labs  Lab 11/02/17 0633  AMMONIA 31   Coagulation Profile: No results for input(s): INR, PROTIME in the  last 168 hours. CBC: Recent Labs  Lab 11/01/17 1352  WBC 5.6  NEUTROABS 3.2  HGB 13.5  HCT 42.3  MCV 88.5  PLT 206   Cardiac Enzymes: Recent Labs  Lab 11/01/17 1352 11/01/17 1954  TROPONINI <0.03 <0.03   BNP: Invalid input(s): POCBNP CBG: Recent Labs  Lab 11/02/17 0728  GLUCAP 88   HbA1C: No results for input(s): HGBA1C in the last 72 hours. Urine analysis:    Component Value Date/Time    COLORURINE YELLOW 11/01/2017 1540   APPEARANCEUR HAZY (A) 11/01/2017 1540   LABSPEC 1.008 11/01/2017 1540   PHURINE 6.0 11/01/2017 1540   GLUCOSEU NEGATIVE 11/01/2017 1540   HGBUR MODERATE (A) 11/01/2017 1540   BILIRUBINUR NEGATIVE 11/01/2017 1540   KETONESUR NEGATIVE 11/01/2017 1540   PROTEINUR NEGATIVE 11/01/2017 1540   UROBILINOGEN 0.2 09/25/2008 1801   NITRITE NEGATIVE 11/01/2017 1540   LEUKOCYTESUR MODERATE (A) 11/01/2017 1540   Sepsis Labs: @LABRCNTIP (procalcitonin:4,lacticidven:4) )No results found for this or any previous visit (from the past 240 hour(s)).   Scheduled Meds: . amLODipine  5 mg Oral Daily  . donepezil  10 mg Oral QHS  . levothyroxine  50 mcg Oral QAC breakfast  . memantine  10 mg Oral BID  . pantoprazole  40 mg Oral Daily  . sertraline  50 mg Oral Daily  . sodium chloride flush  3 mL Intravenous Q12H   Continuous Infusions:  Procedures/Studies: Dg Chest 2 View  Result Date: 11/01/2017 CLINICAL DATA:  Depression, hypersomnolence. History of asthma, dementia, rectal malignancy stage EXAM: CHEST  2 VIEW COMPARISON:  Chest x-ray of October 10, 2011 FINDINGS: The lungs are well-expanded and clear. The heart and pulmonary vascularity are normal. The mediastinum is normal in width. There is no pleural effusion. There is calcification in the wall of the thoracic aorta. The bony thorax is unremarkable. IMPRESSION: There is no active cardiopulmonary disease. Thoracic aortic atherosclerosis. Electronically Signed   By: Renner Sebald  [place] M.D.   On: 11/01/2017 14:27   Ct Head Wo Contrast  Result Date: 11/01/2017 CLINICAL DATA:  Near syncope EXAM: CT HEAD WITHOUT CONTRAST TECHNIQUE: Contiguous axial images were obtained from the base of the skull through the vertex without intravenous contrast. COMPARISON:  Brain MRI 06/25/2017, CT brain 04/15/2017 FINDINGS: Brain: No acute territorial infarction, hemorrhage or intracranial mass is visualized. Mild small vessel ischemic  changes of the white matter. Moderate atrophy. Stable ventricle size. Vascular: No hyperdense vessels.  Carotid artery calcification. Skull: Normal. Negative for fracture or focal lesion. Sinuses/Orbits: No acute finding. Other: None IMPRESSION: 1. No CT evidence for acute intracranial abnormality. 2. Atrophy and small vessel ischemic changes of the white matter. Electronically Signed   By: Donavan Foil M.D.   On: 11/01/2017 14:50    Orson Eva, DO  [facility] Pager [phone]  If 7PM-7AM, please contact night-coverage [email or web address] Password [email or web address] 11/02/2017, 7:41 AM   LOS: 0 days

## 2017-11-02 NOTE — Procedures (Signed)
EEG Report  Clinical History:  Syncope in the setting of dementia.    Technical Summary:  A 19 channel digital EEG recording was performed using the 10-20 international system of electrode placement.  Bipolar and Referential montages were used.  The total recording time was approx 20 minutes.  Findings:  This is a very low voltage recording.  There is no posterior dominant alpha rhythm.  Background frequencies are about 5-6 Hz in range and symmetrical.   No focal slowing is present.  He gets drowsy and does enter stage N2 sleep.  There are no epileptiform discharges or electrographic seizures present.    Impression:  This is an abnormal EEG.  There is moderate generalized slowing of brain activity consistent with prior dementia and/or metabolic, toxic, infectious, hypoxic encephalopathy.  Clinical correlation is recommended.  The patient is not in non-convulsive status epilepticus.    Rogue Jury, MS, MD

## 2017-11-02 NOTE — Progress Notes (Signed)
*  PRELIMINARY RESULTS* Echocardiogram 2D Echocardiogram has been performed.  Leavy Cella 11/02/2017, 10:17 AM

## 2017-11-02 NOTE — Progress Notes (Signed)
Pt urinated in toilet but missed hat, I&O's are not accurate currently.

## 2017-11-03 DIAGNOSIS — I951 Orthostatic hypotension: Secondary | ICD-10-CM | POA: Diagnosis not present

## 2017-11-03 DIAGNOSIS — F0390 Unspecified dementia without behavioral disturbance: Secondary | ICD-10-CM

## 2017-11-03 DIAGNOSIS — R55 Syncope and collapse: Secondary | ICD-10-CM | POA: Diagnosis not present

## 2017-11-03 LAB — URINE CULTURE

## 2017-11-03 LAB — GLUCOSE, CAPILLARY: Glucose-Capillary: 88 mg/dL (ref 65–99)

## 2017-11-03 MED ORDER — CYANOCOBALAMIN 500 MCG PO TABS: 500.0000 ug | ORAL_TABLET | Freq: Every day | ORAL | Status: AC

## 2017-11-03 MED ORDER — ONDANSETRON HCL 4 MG/2ML IJ SOLN
4.0000 mg | Freq: Four times a day (QID) | INTRAMUSCULAR | Status: DC | PRN
  Administered 2017-11-03: 4 mg via INTRAVENOUS
  Filled 2017-11-03: qty 2

## 2017-11-03 MED ORDER — VITAMIN B-12 1000 MCG PO TABS
500.0000 ug | ORAL_TABLET | Freq: Every day | ORAL | Status: DC
  Administered 2017-11-03: 500 ug via ORAL
  Filled 2017-11-03: qty 1

## 2017-11-03 NOTE — Progress Notes (Signed)
IV discontinued,catheter intact.Discharge instructions given on medications,and follow up visits,patient and family verbalized understanding. No c/o pain or discomfort noted.Accompanied by staff to an awaiting vehicle.

## 2017-11-03 NOTE — Discharge Summary (Signed)
Physician Discharge Summary  [name] Goodman OTL:[id number] DOB: 1932-05-20 DOA: 11/01/2017  PCP: Ernestine Conrad, FNP  Admit date: 11/01/2017 Discharge date: 11/03/2017  Admitted From: Home Disposition:  Home  Recommendations for Outpatient Follow-up:  1. Follow up with PCP in 1-2 weeks 2. Please obtain BMP/CBC in one week     Discharge Condition: Stable CODE STATUS: FULL Diet recommendation: Heart Healthy  Brief/Interim Summary: 84 year old female with a history of dementia, hypertension, rectal cancer, hypothyroidism presenting with an episode of unresponsiveness.  The patient stated that she was trying to get up out of her recliner on 11/01/2017.  She had difficulties getting up and was feeling dizzy.  As result, the patient stated that she sat back down and took a nap.  Apparently, her granddaughter went to check up on her later in the afternoon and stated the patient "would not wake up".  As a result, EMS was activated.  Granddaughter reported that the patient was breathing and could see carotid pulsating.  After arrival to the emergency department, the patient became fully awake.  The patient was afebrile hemodynamically stable but hypertensive with blood pressure 193/66.    Discharge Diagnoses:  Unresponsiveness/altered mental status -Question whether the patient truly had syncope -Patient did have orthostatic hypotension with symptoms of dizziness -Patient may have had a component of hypertensive encephalopathy given her initial blood pressure -EEG--moderate generalized slowing, no epileptiform discharges -Ammonia 31 -TSH 3.559 -11/02/2017 serum B12--150--> supplement -05/31/2017 RPR--negative -CT brain negative -Monitor on telemetry -am BMP -personally reviewed CXR--no edema or infiltrates  Sinus bradycardia -Telemetry reviewed--HR in upper 40s at sleep without AV blocks or dysrhythmia. -HR remains in the 50-60s while awake--doubt this contributed to the  patient's episode of unresponsiveness -Personally reviewed EKG--sinus bradycardia without any AV blocks -Secondary to Aricept  Orthostatic hypotension -Continue IV fluids -I personally Repeated orthostatics--improved BP  Pyuria -Started ceftriaxone pending urine culture data  -urine culture showed mixed flora -Discontinue antibiotics as the patient is asymptomatic from a urinary standpoint  Hypothyroidism -Continue Synthroid -TSH 3.559  Dementia with behavioral disturbance -Continue Aricept and Namenda -09/10/2017 MMSE--15/30  Essential hypertension -Continue amlodipine     Discharge Instructions  Discharge Instructions    Diet - low sodium heart healthy   Complete by:  As directed    Increase activity slowly   Complete by:  As directed      Allergies as of 11/03/2017   No Known Allergies     Medication List    TAKE these medications   acetaminophen 325 MG tablet Commonly known as:  TYLENOL Take 650 mg by mouth every 6 (six) hours as needed for mild pain, moderate pain or headache.   albuterol 108 (90 Base) MCG/ACT inhaler Commonly known as:  PROVENTIL HFA;VENTOLIN HFA Inhale 2 puffs into the lungs every 6 (six) hours as needed for wheezing or shortness of breath.   albuterol (2.5 MG/3ML) 0.083% nebulizer solution Commonly known as:  PROVENTIL Take 2.5 mg by nebulization every 6 (six) hours as needed for wheezing or shortness of breath.   amLODipine 5 MG tablet Commonly known as:  NORVASC Take 5 mg by mouth daily.   cyanocobalamin 500 MCG tablet Take 1 tablet (500 mcg total) by mouth daily.   donepezil 10 MG tablet Commonly known as:  ARICEPT Take 1 tablet (10 mg total) by mouth at bedtime.   levothyroxine 50 MCG tablet Commonly known as:  SYNTHROID, LEVOTHROID Take 1 tablet by mouth daily.   memantine 10 MG tablet Commonly known  as:  NAMENDA Take 1 tablet (10 mg total) by mouth 2 (two) times daily.   omeprazole 20 MG capsule Commonly  known as:  PRILOSEC Take 20 mg by mouth daily.   sertraline 50 MG tablet Commonly known as:  ZOLOFT Take 1 tablet by mouth daily.   Vitamin D (Ergocalciferol) 50000 units Caps capsule Commonly known as:  DRISDOL Take 50,000 Units by mouth once a week. Saturdays       No Known Allergies  Consultations:  none   Procedures/Studies: Dg Chest 2 View  Result Date: 11/01/2017 CLINICAL DATA:  Depression, hypersomnolence. History of asthma, dementia, rectal malignancy stage EXAM: CHEST  2 VIEW COMPARISON:  Chest x-ray of October 10, 2011 FINDINGS: The lungs are well-expanded and clear. The heart and pulmonary vascularity are normal. The mediastinum is normal in width. There is no pleural effusion. There is calcification in the wall of the thoracic aorta. The bony thorax is unremarkable. IMPRESSION: There is no active cardiopulmonary disease. Thoracic aortic atherosclerosis. Electronically Signed   By: Phyllistine Domingos  [place] M.D.   On: 11/01/2017 14:27   Ct Head Wo Contrast  Result Date: 11/01/2017 CLINICAL DATA:  Near syncope EXAM: CT HEAD WITHOUT CONTRAST TECHNIQUE: Contiguous axial images were obtained from the base of the skull through the vertex without intravenous contrast. COMPARISON:  Brain MRI 06/25/2017, CT brain 04/15/2017 FINDINGS: Brain: No acute territorial infarction, hemorrhage or intracranial mass is visualized. Mild small vessel ischemic changes of the white matter. Moderate atrophy. Stable ventricle size. Vascular: No hyperdense vessels.  Carotid artery calcification. Skull: Normal. Negative for fracture or focal lesion. Sinuses/Orbits: No acute finding. Other: None IMPRESSION: 1. No CT evidence for acute intracranial abnormality. 2. Atrophy and small vessel ischemic changes of the white matter. Electronically Signed   By: Donavan Foil M.D.   On: 11/01/2017 14:50         Discharge Exam: Vitals:   11/03/17 0000 11/03/17 0400  BP: (!) 152/52 (!) 164/67  Pulse: (!) 58 (!) 56    Resp: 18 20  Temp: 98.6 F (37 C) 98.3 F (36.8 C)  SpO2: 97% 98%   Vitals:   11/02/17 1700 11/02/17 2000 11/03/17 0000 11/03/17 0400  BP: 123/63 (!) 144/60 (!) 152/52 (!) 164/67  Pulse: 65 (!) 56 (!) 58 (!) 56  Resp: 16 20 18 20   Temp: 98.6 F (37 C) 98.5 F (36.9 C) 98.6 F (37 C) 98.3 F (36.8 C)  TempSrc: Oral Oral Oral Oral  SpO2: 97% 100% 97% 98%  Weight:    65.7 kg (144 lb 13.5 oz)  Height:        General: Pt is alert, awake, not in acute distress Cardiovascular: RRR, S1/S2 +, no rubs, no gallops Respiratory: CTA bilaterally, no wheezing, no rhonchi Abdominal: Soft, NT, ND, bowel sounds + Extremities: no edema, no cyanosis   The results of significant diagnostics from this hospitalization (including imaging, microbiology, ancillary and laboratory) are listed below for reference.    Significant Diagnostic Studies: Dg Chest 2 View  Result Date: 11/01/2017 CLINICAL DATA:  Depression, hypersomnolence. History of asthma, dementia, rectal malignancy stage EXAM: CHEST  2 VIEW COMPARISON:  Chest x-ray of October 10, 2011 FINDINGS: The lungs are well-expanded and clear. The heart and pulmonary vascularity are normal. The mediastinum is normal in width. There is no pleural effusion. There is calcification in the wall of the thoracic aorta. The bony thorax is unremarkable. IMPRESSION: There is no active cardiopulmonary disease. Thoracic aortic atherosclerosis. Electronically Signed  By: Linkin Vizzini  [place] M.D.   On: 11/01/2017 14:27   Ct Head Wo Contrast  Result Date: 11/01/2017 CLINICAL DATA:  Near syncope EXAM: CT HEAD WITHOUT CONTRAST TECHNIQUE: Contiguous axial images were obtained from the base of the skull through the vertex without intravenous contrast. COMPARISON:  Brain MRI 06/25/2017, CT brain 04/15/2017 FINDINGS: Brain: No acute territorial infarction, hemorrhage or intracranial mass is visualized. Mild small vessel ischemic changes of the white matter. Moderate atrophy.  Stable ventricle size. Vascular: No hyperdense vessels.  Carotid artery calcification. Skull: Normal. Negative for fracture or focal lesion. Sinuses/Orbits: No acute finding. Other: None IMPRESSION: 1. No CT evidence for acute intracranial abnormality. 2. Atrophy and small vessel ischemic changes of the white matter. Electronically Signed   By: Donavan Foil M.D.   On: 11/01/2017 14:50     Microbiology: Recent Results (from the past 240 hour(s))  Urine culture     Status: Abnormal   Collection Time: 11/01/17  3:40 PM  Result Value Ref Range Status   Specimen Description URINE, CLEAN CATCH  Final   Special Requests NONE  Final   Culture MULTIPLE SPECIES PRESENT, SUGGEST RECOLLECTION (A)  Final   Report Status 11/03/2017 FINAL  Final     Labs: Basic Metabolic Panel: Recent Labs  Lab 11/01/17 1352  NA 140  K 4.0  CL 104  CO2 24  GLUCOSE 101*  BUN 17  CREATININE 0.87  CALCIUM 10.0   Liver Function Tests: Recent Labs  Lab 11/01/17 1352  AST 22  ALT 9*  ALKPHOS 114  BILITOT 0.4  PROT 7.6  ALBUMIN 4.1   No results for input(s): LIPASE, AMYLASE in the last 168 hours. Recent Labs  Lab 11/02/17 0633  AMMONIA 31   CBC: Recent Labs  Lab 11/01/17 1352  WBC 5.6  NEUTROABS 3.2  HGB 13.5  HCT 42.3  MCV 88.5  PLT 206   Cardiac Enzymes: Recent Labs  Lab 11/01/17 1352 11/01/17 1954  TROPONINI <0.03 <0.03   BNP: Invalid input(s): POCBNP CBG: Recent Labs  Lab 11/02/17 0728 11/02/17 1103 11/03/17 0729  GLUCAP 88 92 88    Time coordinating discharge:  Greater than 30 minutes  Signed:  Orson Eva, DO [facility] Pager: [phone] 11/03/2017, 11:45 AM

## 2018-03-07 ENCOUNTER — Emergency Department (HOSPITAL_COMMUNITY)
Admission: EM | Admit: 2018-03-07 | Discharge: 2018-03-07 | Disposition: A | Discharge status: Home / Self Care | Payer: Medicare HMO | Attending: Emergency Medicine | Admitting: Emergency Medicine

## 2018-03-07 ENCOUNTER — Encounter (HOSPITAL_COMMUNITY): Payer: Self-pay | Admitting: Emergency Medicine

## 2018-03-07 ENCOUNTER — Emergency Department (HOSPITAL_COMMUNITY): Payer: Medicare HMO

## 2018-03-07 ENCOUNTER — Other Ambulatory Visit: Payer: Self-pay

## 2018-03-07 DIAGNOSIS — N200 Calculus of kidney: Secondary | ICD-10-CM | POA: Insufficient documentation

## 2018-03-07 DIAGNOSIS — I10 Essential (primary) hypertension: Secondary | ICD-10-CM | POA: Insufficient documentation

## 2018-03-07 DIAGNOSIS — F0390 Unspecified dementia without behavioral disturbance: Secondary | ICD-10-CM | POA: Insufficient documentation

## 2018-03-07 DIAGNOSIS — J45909 Unspecified asthma, uncomplicated: Secondary | ICD-10-CM | POA: Diagnosis not present

## 2018-03-07 DIAGNOSIS — E039 Hypothyroidism, unspecified: Secondary | ICD-10-CM | POA: Insufficient documentation

## 2018-03-07 DIAGNOSIS — Z79899 Other long term (current) drug therapy: Secondary | ICD-10-CM | POA: Insufficient documentation

## 2018-03-07 DIAGNOSIS — R1031 Right lower quadrant pain: Secondary | ICD-10-CM | POA: Diagnosis present

## 2018-03-07 LAB — CBC WITH DIFFERENTIAL/PLATELET
Basophils Absolute: 0 10*3/uL (ref 0.0–0.1)
Basophils Relative: 0 %
Eosinophils Absolute: 0 10*3/uL (ref 0.0–0.7)
Eosinophils Relative: 0 %
HEMATOCRIT: 41 % (ref 36.0–46.0)
HEMOGLOBIN: 13.1 g/dL (ref 12.0–15.0)
LYMPHS ABS: 1.2 10*3/uL (ref 0.7–4.0)
Lymphocytes Relative: 14 %
MCH: 27.9 pg (ref 26.0–34.0)
MCHC: 32 g/dL (ref 30.0–36.0)
MCV: 87.4 fL (ref 78.0–100.0)
MONOS PCT: 7 %
Monocytes Absolute: 0.6 10*3/uL (ref 0.1–1.0)
NEUTROS ABS: 6.6 10*3/uL (ref 1.7–7.7)
NEUTROS PCT: 79 %
Platelets: 231 10*3/uL (ref 150–400)
RBC: 4.69 MIL/uL (ref 3.87–5.11)
RDW: 14.2 % (ref 11.5–15.5)
WBC: 8.4 10*3/uL (ref 4.0–10.5)

## 2018-03-07 LAB — COMPREHENSIVE METABOLIC PANEL
ALK PHOS: 87 U/L (ref 38–126)
ALT: 14 U/L (ref 14–54)
ANION GAP: 14 (ref 5–15)
AST: 29 U/L (ref 15–41)
Albumin: 4.1 g/dL (ref 3.5–5.0)
BUN: 17 mg/dL (ref 6–20)
CO2: 26 mmol/L (ref 22–32)
Calcium: 10.9 mg/dL — ABNORMAL HIGH (ref 8.9–10.3)
Chloride: 99 mmol/L — ABNORMAL LOW (ref 101–111)
Creatinine, Ser: 0.86 mg/dL (ref 0.44–1.00)
GFR calc non Af Amer: >60 mL/min (ref >60)
Glucose, Bld: 104 mg/dL — ABNORMAL HIGH (ref 65–99)
Potassium: 3.4 mmol/L — ABNORMAL LOW (ref 3.5–5.1)
SODIUM: 139 mmol/L (ref 135–145)
Total Bilirubin: 1.4 mg/dL — ABNORMAL HIGH (ref 0.3–1.2)
Total Protein: 7.7 g/dL (ref 6.5–8.1)

## 2018-03-07 LAB — LIPASE, BLOOD: Lipase: 47 U/L (ref 11–51)

## 2018-03-07 MED ORDER — IOPAMIDOL (ISOVUE-300) INJECTION 61%
30.0000 mL | Freq: Once | INTRAVENOUS | Status: AC | PRN
  Administered 2018-03-07: 30 mL via ORAL

## 2018-03-07 MED ORDER — IOPAMIDOL (ISOVUE-300) INJECTION 61%
100.0000 mL | Freq: Once | INTRAVENOUS | Status: AC | PRN
  Administered 2018-03-07: 100 mL via INTRAVENOUS

## 2018-03-07 MED ORDER — TAMSULOSIN HCL 0.4 MG PO CAPS: 0.4000 mg | ORAL_CAPSULE | Freq: Every day | ORAL | 0 refills | Status: AC

## 2018-03-07 MED ORDER — HYDROCODONE-ACETAMINOPHEN 5-325 MG PO TABS: 1.0000 | ORAL_TABLET | Freq: Four times a day (QID) | ORAL | 0 refills | Status: DC | PRN

## 2018-03-07 NOTE — ED Notes (Signed)
Pt was informed that we need a urine sample. Pt states that she can not urinate at this time. 

## 2018-03-07 NOTE — ED Notes (Addendum)
Currently not having pain.  Daughter reports that pt was crying about 10 am c/o right lower abdominal pain.  Daughter spoke with Dr Edrick Oh office and recommended pt come to ED.  Daughter reports that pt have not been eating well for the past months.  Today pt ate one one teaspoon of chicken pot pie.  Pt has had a gradual weight loss in last few months.

## 2018-03-07 NOTE — ED Notes (Signed)
Patient transported to CT 

## 2018-03-07 NOTE — ED Triage Notes (Signed)
Pt a/o to some. Daughter with pt. Pt has hx of dementia. Daughter states pt c/o abd pain and was bent over earlier in pain. Pt denies pain. Denies v/d. Nad. No ss of pain in triage.

## 2018-03-07 NOTE — ED Notes (Signed)
Patient went to bathroom and missed the hat and urinated on the floor

## 2018-03-07 NOTE — ED Provider Notes (Signed)
Emergency Department Provider Note   I have reviewed the triage vital signs and the nursing notes.   HISTORY  Chief Complaint Abdominal Pain   HPI [name] Goodman is a 84 y.o. female with multiple medical problems as documented below the presents to the emergency department today for belly pain.  Patient's daughter gives history as the patient has dementia.  It sounds like the patient has had a at least 5 or 6 months if not a years worth of unintended weight loss of approximately 40 to 50 pounds.  Her doctor stopped her Aricept and omeprazole a couple days ago and then today the patient had severe right lower quadrant abdominal pain had her bent over and crying the last approximate 4 to 5 hours but resolved on its own.  She called the primary doctor but was sent here for further evaluation.  At this time she is astigmatic.  The daughters report no recent fevers, dysuria, frequency, diarrhea, constipation, vomiting or trauma. No other associated or modifying symptoms.    Past Medical History:  Diagnosis Date  . Anxiety   . Arthritis   . Asthma   . Cancer [facility]) 2010   Dr Arnoldo Morale, Stage I rectal cancer  . Dementia   . Depression   . GERD (gastroesophageal reflux disease)   . Hypertension   . Hypothyroidism     Patient Active Problem List   Diagnosis Date Noted  . Orthostatic hypotension 11/02/2017  . Syncope 11/01/2017  . Hypothyroidism 11/01/2017  . Depression 11/01/2017  . Dementia with behavioral disturbance 09/10/2017  . Dementia 05/31/2017  . GASTROINTESTINAL HEMORRHAGE, HX OF 04/28/2009  . HYPERTENSION 04/26/2009  . ARTHRITIS 04/26/2009  . Malignant neoplasm of rectum ([place]) 11/10/2008    Past Surgical History:  Procedure Laterality Date  . CATARACT EXTRACTION Left   . CATARACT EXTRACTION W/PHACO Right 05/12/2013   Procedure: CATARACT EXTRACTION PHACO AND INTRAOCULAR LENS PLACEMENT (IOC);  Surgeon: Tonny Branch, MD;  Location: [facility] ORS;  Service: Ophthalmology;   Laterality: Right;  CDE 25.23  . COLON RESECTION  2010   rectal cancer  . COLONOSCOPY N/A 09/16/2013   Procedure: COLONOSCOPY;  Surgeon: Danie Binder, MD;  Location: [facility] ENDO SUITE;  Service: Endoscopy;  Laterality: N/A;  9:45-moved to 955  . ESOPHAGOGASTRODUODENOSCOPY  03/08/2009   VXB:[id number] esophagus, small hiatal hernia, otherwise normal  . Ileocolonoscopy  03/08/2009   SPQ:ZRAQ surgical scar present in rectal mucosa with polypoid lesion,overlying it as described above, status post a clean resection/Pan colonic diverticula, remainder of colonic mucosa appeared    Current Outpatient Rx  . Order #: [id number] Class: Historical Med  . Order #: [id number] Class: Historical Med  . Order #: [id number] Class: Historical Med  . Order #: [id number] Class: Historical Med  . Order #: [id number] Class: Historical Med  . Order #: [id number] Class: Historical Med  . Order #: [id number] Class: Normal  . Order #: [id number] Class: Historical Med  . Order #: [id number] Class: Historical Med  . Order #: [id number] Class: Historical Med  . Order #: [id number] Class: OTC  . Order #: [id number] Class: Historical Med  . Order #: [id number] Class: Print  . Order #: [id number] Class: Print    Allergies Patient has no known allergies.  Family History  Problem Relation Age of Onset  . Dementia Mother   . Dementia Father   . Diabetes Sister   . Colon cancer Neg Hx     Social History Social History   Tobacco Use  . Smoking status: Never Smoker  . Smokeless tobacco: Never Used  Substance Use Topics  . Alcohol use: No  . Drug use: No    Review of Systems  All other systems negative except as documented in the HPI. All pertinent positives and negatives as reviewed in the HPI. ____________________________________________   PHYSICAL EXAM:  VITAL SIGNS: ED Triage Vitals  Enc Vitals Group     BP 03/07/18 1527 (!) 154/82     Pulse Rate 03/07/18 1527 79     Resp 03/07/18 1527 16     Temp 03/07/18 1527 98.5 F  (36.9 C)     Temp Source 03/07/18 1527 Oral     SpO2 03/07/18 1527 99 %     Weight 03/07/18 1530 114 lb (51.7 kg)     Height 03/07/18 1530 5\' 2"  (1.575 m)     Head Circumference --      Peak Flow --      Pain Score 03/07/18 1529 0     Pain Loc --      Pain Edu? --      Excl. in [place]? --     Constitutional: Alert. Well appearing and in no acute distress. Eyes: Conjunctivae are normal. PERRL. EOMI. Head: Atraumatic. Nose: No congestion/rhinnorhea. Mouth/Throat: Mucous membranes are moist.  Oropharynx non-erythematous. Neck: No stridor.  No meningeal signs.   Cardiovascular: Normal rate, regular rhythm. Good peripheral circulation. Grossly normal heart sounds.   Respiratory: Normal respiratory effort.  No retractions. Lungs CTAB. Gastrointestinal: Soft and nontender. No distention.  Musculoskeletal: No lower extremity tenderness nor edema. No gross deformities of extremities. Neurologic:  Normal speech and language. No gross focal neurologic deficits are appreciated.  Skin:  Skin is warm, dry and intact. No rash noted.   ____________________________________________   LABS (all labs ordered are listed, but only abnormal results are displayed)  Labs Reviewed  COMPREHENSIVE METABOLIC PANEL - Abnormal; Notable for the following components:      Result Value   Potassium 3.4 (*)    Chloride 99 (*)    Glucose, Bld 104 (*)    Calcium 10.9 (*)    Total Bilirubin 1.4 (*)    All other components within normal limits  LIPASE, BLOOD  CBC WITH DIFFERENTIAL/PLATELET   ____________________________________________  EKG   EKG Interpretation  Date/Time:    Ventricular Rate:    PR Interval:    QRS Duration:   QT Interval:    QTC Calculation:   R Axis:     Text Interpretation:         ____________________________________________  RADIOLOGY  Ct Abdomen Pelvis W Contrast  Result Date: 03/07/2018 CLINICAL DATA:  84 year old female with acute abdominal pain today. History of  colonic resection and rectal cancer. EXAM: CT ABDOMEN AND PELVIS WITH CONTRAST TECHNIQUE: Multidetector CT imaging of the abdomen and pelvis was performed using the standard protocol following bolus administration of intravenous contrast. CONTRAST:  100 cc intravenous Isovue-300 COMPARISON:  11/09/2008 CT FINDINGS: Lower chest: No acute abnormality. Hepatobiliary: The liver and gallbladder are unremarkable. No biliary dilatation. Pancreas: Unremarkable Spleen: Unremarkable Adrenals/Urinary Tract: A 9 mm RIGHT UVJ calculus causes severe RIGHT hydroureteronephrosis. The adrenal glands and LEFT kidney are unremarkable. A RIGHT renal cyst is present. Stomach/Bowel: Stomach is within normal limits. No evidence of bowel wall thickening, distention, or inflammatory changes. Vascular/Lymphatic: Aortic atherosclerosis. No enlarged abdominal or pelvic lymph nodes. Reproductive: Uterus and bilateral adnexa are unremarkable. Other: No ascites, abscess or pneumoperitoneum. Musculoskeletal: No acute or significant osseous findings. IMPRESSION: 1. 9 mm RIGHT UVJ calculus causing severe RIGHT hydroureteronephrosis.  2.  Aortic Atherosclerosis (ICD10-I70.0). Electronically Signed   By: Margarette Canada M.D.   On: 03/07/2018 21:08    ____________________________________________   PROCEDURES  Procedure(s) performed:   Procedures   ____________________________________________   INITIAL IMPRESSION / ASSESSMENT AND PLAN / ED COURSE  History of rectal cancer, weight loss and abdominal pain will check a CT scan to make sure there is no evidence of metastasis.  Could also have been gas.  Could also be rebound reflux from stopping her omeprazole abruptly.  Found to have large kidney stone at UVJ. Will refer to urology. Possible cause of pain? But now not having pain.   Clinical Course as of Mar 09 1351  Thu Mar 07, 2018  2058 CT ABDOMEN PELVIS W CONTRAST [JM]    Clinical Course User Index [JM] Jerah Esty, Corene Cornea, MD     Pertinent labs & imaging results that were available during my care of the patient were reviewed by me and considered in my medical decision making (see chart for details).  ____________________________________________  FINAL CLINICAL IMPRESSION(S) / ED DIAGNOSES  Final diagnoses:  Kidney stone     MEDICATIONS GIVEN DURING THIS VISIT:  Medications  iopamidol (ISOVUE-300) 61 % injection 30 mL (30 mLs Oral Contrast Given 03/07/18 2035)  iopamidol (ISOVUE-300) 61 % injection 100 mL (100 mLs Intravenous Contrast Given 03/07/18 2035)     NEW OUTPATIENT MEDICATIONS STARTED DURING THIS VISIT:  Discharge Medication List as of 03/07/2018 10:06 PM    START taking these medications   Details  HYDROcodone-acetaminophen (NORCO/VICODIN) 5-325 MG tablet Take 1 tablet by mouth every 6 (six) hours as needed for severe pain., Starting Thu 03/07/2018, Print    tamsulosin (FLOMAX) 0.4 MG CAPS capsule Take 1 capsule (0.4 mg total) by mouth daily., Starting Thu 03/07/2018, Print        Note:  This note was prepared with assistance of Dragon voice recognition software. Occasional wrong-word or sound-a-like substitutions may have occurred due to the inherent limitations of voice recognition software.   Merrily Pew, MD 03/08/18 1352

## 2018-03-12 ENCOUNTER — Ambulatory Visit: Payer: Medicare HMO | Admitting: Neurology

## 2018-03-12 ENCOUNTER — Encounter: Payer: Self-pay | Admitting: Neurology

## 2018-03-12 VITALS — BP 138/83 | HR 67

## 2018-03-12 DIAGNOSIS — F0391 Unspecified dementia with behavioral disturbance: Secondary | ICD-10-CM

## 2018-03-12 DIAGNOSIS — R269 Unspecified abnormalities of gait and mobility: Secondary | ICD-10-CM

## 2018-03-12 MED ORDER — QUETIAPINE FUMARATE 25 MG PO TABS: 25.0000 mg | ORAL_TABLET | Freq: Every day | ORAL | 11 refills | Status: DC

## 2018-03-12 NOTE — Progress Notes (Signed)
PATIENT: [name] Goodman DOB: 1932-02-14  Chief Complaint  Patient presents with  . Dementia    MMSE 18/30 - 9 animals.  She is here with her daughters, [name] Goodman and [name] Goodman.  She has good and bad days with her memory. Her PCP stopped her donepezil due to weight loss.  She is still taking memantine 8m, BID.     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  I reviewed and summarized the referring note, she had past medical history of hypertension, hypothyroidism, on supplement, depression, history of stage I rectal cancer, was treated with surgery in 2010, no chemotherapy or radiation therapy is required, she presented with rectal bleeding at that time.  She retired from [employer] about 20 years ago, She was able to help her nephew, and family members, quit driving around [id number] after family found she has mild memory loss, she tends to repeat herself, tried to minimize her memory problem,  Her mother, sister, , brother and her daughter [name] Goodman age [age]suffered dementia.  She now lives alone, independent of daily activities,family member check on her frequently,she complains of feeling anxious at evening time, hear people knock on the door, is afraid to open the door   Laboratory evaluation in September 2018:normal CMP with exception of mildly decreased creatinine 1.09,glucose 120, normal CBC,  UPDATE Sep 10 2017: Laboratory evaluation August 22897 normal ESR, folic acid, C-reactive protein, thyroid function test, vitamin D, ANA, RPR, low normal range vitamin B12, 233  I have personally reviewed MRI of brain September 2018, diffuse generalized atrophy mild supratentorium small vessel disease  She has less appetite, she drinks ensure sometimes, she also complains some GI side  effect, she is taking namenda 114mbid, aricept 1040mday, she get frustrated easily, makeup ideas sometimes, 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 Donepezil Other (See Comments)    Weight loss    HOME MEDICATIONS: Current Outpatient Medications  Medication Sig Dispense Refill  . acetaminophen (TYLENOL) 325 MG tablet Take 650 mg by mouth every 6 (six) hours as needed for mild pain, moderate pain or headache.    . albuterol (PROVENTIL HFA;VENTOLIN HFA) 108 (90 BASE) MCG/ACT inhaler Inhale 2 puffs into the lungs every 6 (six) hours as needed for wheezing or shortness of breath.    . aMarland Kitchenbuterol (PROVENTIL) (2.5 MG/3ML) 0.083% nebulizer solution Take 2.5 mg by nebulization every 6 (six) hours as needed for wheezing or shortness of breath.    . aMarland KitchenLODipine (NORVASC) 5 MG tablet Take 5 mg by mouth daily.    . aMarland Kitchenorvastatin (LIPITOR) 10 MG tablet Take 10 mg by mouth every evening.  6  . HYDROcodone-acetaminophen (NORCO/VICODIN) 5-325 MG tablet Take 1 tablet by mouth every 6 (six) hours as needed for  severe pain. 10 tablet 0  . levothyroxine (SYNTHROID, LEVOTHROID) 50 MCG tablet Take 1 tablet by mouth daily.  5  . memantine (NAMENDA) 10 MG tablet Take 1 tablet (10 mg total) by mouth 2 (two) times daily. 60 tablet 11  . ranitidine (ZANTAC) 150 MG capsule Take 150 mg by mouth every evening.    . sertraline  (ZOLOFT) 50 MG tablet Take 1 tablet by mouth daily.    . tamsulosin (FLOMAX) 0.4 MG CAPS capsule Take 1 capsule (0.4 mg total) by mouth daily. 30 capsule 0  . vitamin B-12 500 MCG tablet Take 1 tablet (500 mcg total) by mouth daily.    . Vitamin D, Ergocalciferol, (DRISDOL) 50000 units CAPS capsule Take 50,000 Units by mouth once a week. Saturdays     No current facility-administered medications for this visit.     PAST MEDICAL HISTORY: Past Medical History:  Diagnosis Date  . Anxiety   . Arthritis   . Asthma   . Cancer [facility]) 2010   Dr Arnoldo Morale, Stage I rectal cancer  . Dementia   . Depression   . GERD (gastroesophageal reflux disease)   . Hypertension   . Hypothyroidism     PAST SURGICAL HISTORY: Past Surgical History:  Procedure Laterality Date  . CATARACT EXTRACTION Left   . CATARACT EXTRACTION W/PHACO Right 05/12/2013   Procedure: CATARACT EXTRACTION PHACO AND INTRAOCULAR LENS PLACEMENT (IOC);  Surgeon: Tonny Branch, MD;  Location: [facility];  Service: Ophthalmology;  Laterality: Right;  CDE 25.23  . COLON RESECTION  2010   rectal cancer  . COLONOSCOPY N/A 09/16/2013   Procedure: COLONOSCOPY;  Surgeon: Danie Binder, MD;  Location: [facility]ITE;  Service: Endoscopy;  Laterality: N/A;  9:45-moved to 955  . ESOPHAGOGASTRODUODENOSCOPY  03/08/2009   HWT:UUEKCM esophagus, small hiatal hernia, otherwise normal  . Ileocolonoscopy  03/08/2009   KLK:JZPH surgical scar present in rectal mucosa with polypoid lesion,overlying it as described above, status post a clean resection/Pan colonic diverticula, remainder of colonic mucosa appeared    FAMILY HISTORY: Family History  Problem Relation Age of Onset  . Dementia Mother   . Dementia Father   . Diabetes Sister   . Colon cancer Neg Hx     SOCIAL HISTORY:  Social History   Socioeconomic History  . Marital status: Widowed    Spouse name: Not on file  . Number of children: 4  . Years of education: Not on file  . Highest  education level: Not on file  Occupational History  . Not on file  Social Needs  . Financial resource strain: Not on file  . Food insecurity:    Worry: Not on file    Inability: Not on file  . Transportation needs:    Medical: Not on file    Non-medical: Not on file  Tobacco Use  . Smoking status: Never Smoker  . Smokeless tobacco: Never Used  Substance and Sexual Activity  . Alcohol use: No  . Drug use: No  . Sexual activity: Yes    Birth control/protection: Post-menopausal  Lifestyle  . Physical activity:    Days per week: Not on file    Minutes per session: Not on file  . Stress: Not on file  Relationships  . Social connections:    Talks on phone: Not on file    Gets together: Not on file    Attends religious service: Not on file    Active member of club or organization: Not on file  Attends meetings of clubs or organizations: Not on file    Relationship status: Not on file  . Intimate partner violence:    Fear of current or ex partner: Not on file    Emotionally abused: Not on file    Physically abused: Not on file    Forced sexual activity: Not on file  Other Topics Concern  . Not on file  Social History Narrative  . Not on file     PHYSICAL EXAM   Vitals:   03/12/18 1128  BP: 138/83  Pulse: 67    Not recorded      There is no height or weight on file to calculate BMI.  PHYSICAL EXAMNIATION:  Gen: NAD, conversant, well nourised, obese, well groomed                     Cardiovascular: Regular rate rhythm, no peripheral edema, warm, nontender. Eyes: Conjunctivae clear without exudates or hemorrhage Neck: Supple, no carotid bruits. Pulmonary: Clear to auscultation bilaterally   NEUROLOGICAL EXAM:  MMSE - Mini Mental State Exam 03/12/2018 09/10/2017 05/31/2017  Orientation to time '2 1 2  ' Orientation to Place '3 3 3  ' Registration '3 3 3  ' Attention/ Calculation 3 1 0  Recall 0 0 0  Language- name 2 objects '2 2 2  ' Language- repeat '1 1 1    ' Language- follow 3 step command '3 2 3  ' Language- read & follow direction '1 1 1  ' Write a sentence 0 1 1  Copy design 0 0 0  Total score '18 15 16   ' Animal naming 9 CRANIAL NERVES: CN II: Visual fields are full to confrontation.. Pupils are round equal and briskly reactive to light. CN III, IV, VI: extraocular movement are normal. No ptosis. CN V: Facial sensation is intact to pinprick in all 3 divisions bilaterally. Corneal responses are intact.  CN VII: Face is symmetric with normal eye closure and smile. CN VIII: Hearing is normal to rubbing fingers CN IX, X: Palate elevates symmetrically. Phonation is normal. CN XI: Head turning and shoulder shrug are intact CN XII: Tongue is midline with normal movements and no atrophy.  MOTOR: There is no pronator drift of out-stretched arms. Muscle bulk and tone are normal. Muscle strength is normal.  REFLEXES: Reflexes are 2+ and symmetric at the biceps, triceps, knees, and ankles. Plantar responses are flexor.  SENSORY: Intact to light touch, pinprick, positional sensation and vibratory sensation are intact in fingers and toes.  COORDINATION: Rapid alternating movements and fine finger movements are intact. There is no dysmetria on finger-to-nose and heel-knee-shin.    GAIT/STANCE: Need to push up to get up from seated position, mildly unsteady  DIAGNOSTIC DATA (LABS, IMAGING, TESTING) - I reviewed patient records, labs, notes, testing and imaging myself where available.   ASSESSMENT AND PLAN  [name] Goodman is a 84 y.o. female    Dementia with agitation  Mini-Mental Status Examination 18/30   Strong family history of dementia  Most consistent with central nervous system degenerative disorder  Continue Namenda 10 mg twice a day, Aricept 10 mg once a day was stopped due to weight loss, decreased appetite   Seroquel 25 mg every night for palpitations  Marcial Pacas, M.D. Ph.D.  [facility] [address] [place], [place] [address] Ph: [phone] Fax: [phone]  CC: Ernestine Conrad, FNP

## 2018-04-03 ENCOUNTER — Other Ambulatory Visit: Payer: Self-pay | Admitting: Neurology

## 2018-05-02 ENCOUNTER — Encounter (HOSPITAL_COMMUNITY): Payer: Self-pay | Admitting: *Deleted

## 2018-05-02 ENCOUNTER — Other Ambulatory Visit: Payer: Self-pay

## 2018-05-02 ENCOUNTER — Other Ambulatory Visit: Payer: Self-pay | Admitting: Urology

## 2018-05-10 NOTE — H&P (Signed)
Office Visit Report     04/30/2018   --------------------------------------------------------------------------------   [name] Goodman  MRN: [id number]  PRIMARY CARE:    DOB: 1932-04-18, 84 year old Female  REFERRING:  Merrily Pew, MD  SSN:   PROVIDER:  Raynelle Bring, M.D.    LOCATION:  [facility] [phone]   --------------------------------------------------------------------------------   CC/HPI: Right ureteral calculus   [name] returns today for further evaluation of her large 9 mm right UVJ calculus. It was felt that she might have a uric acid stone during her initial evaluation she has been maintained on tamsulosin and potassium citrate with hopes that she would subsequently pass her stone. She has remained completely asymptomatic and has not had any flank pain, hematuria, or other specific urinary complaints since her visit. She follows up today for further evaluation. She was supposed to have a repeat CT scan of the pelvis prior to her appointment today but did not have this performed. Thus far, she has not been able to give a urinalysis either.     ALLERGIES: None   MEDICATIONS: Levothyroxine Sodium  Potassium Citrate Er 10 meq (1,080 mg) tablet, extended release 2 tablet PO BID  Amlodipine Besylate 5 mg tablet  Sertraline Hcl     Notes: Pt daughter not sure of medication will call back with exact meds and strength     GU PSH: Catheterization For Collection Of Specimen, Single Patient, All Places Of Service - 03/13/2018      PSH Notes: colon cancer   NON-GU PSH: None   GU PMH: Ureteral calculus - 03/13/2018      PMH Notes: -   1) Urolithiasis: She presented to me in May 2019 with a 9 mm right UVJ calculus. Her renal function was normal and she had become asymptomatic. It was felt that her stone could possibly be uric acid and she elected treatment with pH manipulation.   NON-GU PMH: Colon Cancer, History Dementia in other diseases classified  elsewhere without behavioral disturbance Hypertension Hypothyroidism    FAMILY HISTORY: 2 daughters - No Family History 1 son - No Family History Dementia Of Alzheimer's Type - Other Hypertension - Runs in Family   SOCIAL HISTORY: Marital Status: Widowed Preferred Language: English; Race: Black or African American Current Smoking Status: Patient has never smoked.   Tobacco Use Assessment Completed: Used Tobacco in last 30 days? Has never drank.  Drinks 2 caffeinated drinks per day. Has not had a blood transfusion.    REVIEW OF SYSTEMS:    GU Review Female:   Patient denies frequent urination, hard to postpone urination, burning /pain with urination, get up at night to urinate, leakage of urine, stream starts and stops, trouble starting your stream, have to strain to urinate, and currently pregnant.  Gastrointestinal (Lower):   Patient denies diarrhea and constipation.  Gastrointestinal (Upper):   Patient denies nausea and vomiting.  Constitutional:   Patient denies fever, night sweats, weight loss, and fatigue.  Skin:   Patient denies skin rash/ lesion and itching.  Eyes:   Patient denies blurred vision and double vision.  Ears/ Nose/ Throat:   Patient denies sore throat and sinus problems.  Hematologic/Lymphatic:   Patient denies swollen glands and easy bruising.  Cardiovascular:   Patient denies leg swelling and chest pains.  Respiratory:   Patient denies cough and shortness of breath.  Endocrine:   Patient denies excessive thirst.  Musculoskeletal:   Patient denies back pain and joint pain.  Neurological:  Patient denies headaches and dizziness.  Psychologic:   Patient denies depression and anxiety.   VITAL SIGNS:      04/30/2018 10:42 AM  Weight 108 lb / 48.99 kg  Height 60 in / 152.4 cm  BP 123/75 mmHg  Pulse 62 /min  BMI 21.1 kg/m   MULTI-SYSTEM PHYSICAL EXAMINATION:    Constitutional: Well-nourished. No physical deformities. Normally developed. Good grooming.   Respiratory: No use of accessory muscles. Normal breath sounds. Clear bilaterally.  Cardiovascular: Regular rate and rhythm. No murmur, no gallop. Normal temperature, normal extremity pulses, no swelling, no varicosities.  Gastrointestinal: No mass, no tenderness, no rigidity, non obese abdomen. No CVA tenderness.     PAST DATA REVIEWED:  Source Of History:  Patient  Urine Test Review:   Urinalysis  X-Ray Review: C.T. Pelvis: Reviewed Films.    Notes:                     I independently reviewed her CT scan of the pelvis. She has a persistent 9 mm right UVJ calculus that this not appear significantly changed from her prior imaging in May.   PROCEDURES:         C.T. Pelvis w/o Contrast - 01601               Urinalysis w/Scope Dipstick Dipstick Cont'd Micro  Color: Yellow Bilirubin: Neg mg/dL WBC/hpf: >60/hpf  Appearance: Cloudy Ketones: Neg mg/dL RBC/hpf: NS (Not Seen)  Specific Gravity: 1.020 Blood: Neg ery/uL Bacteria: Few (10-25/hpf)  pH: 6.0 Protein: 1+ mg/dL Cystals: NS (Not Seen)  Glucose: Neg mg/dL Urobilinogen: 0.2 mg/dL Casts: NS (Not Seen)    Nitrites: Neg Trichomonas: Not Present    Leukocyte Esterase: 3+ leu/uL Mucous: Present      Epithelial Cells: 6 - 10/hpf      Yeast: NS (Not Seen)      Sperm: Not Present    ASSESSMENT:      ICD-10 Details  1 GU:   Ureteral calculus - N20.1    PLAN:           Orders Labs Urine Culture, BMP  X-Rays: C.T. Pelvis Without Contrast - CT pelvis to re-evaluate uric acid stone. Was scheduled to be done on 7/11 but patient did not have it done.  X-Ray Notes: ...          Schedule Return Visit/Planned Activity: Other See Visit Notes             Note: Will call to schedule surgery          Document Letter(s):  Created for Patient: Clinical Summary         Notes:   1. Right ureteral calculus: I reviewed her CT scan today with her and her daughter. Her stone is persistent and appears to measure higher density today and is  faintly visible on her scout film indicating this is not likely a uric acid stone. We therefore reviewed options of continued observation albeit with a risk of declining renal function of the right kidney with time. Alternatively, we discussed proceeding with right ureteroscopic laser lithotripsy versus other means of treatment such as ESWL or percutaneous therapy. Considering the location of her stone and the desire to proceed with definitive therapy in 1 procedure, I did recommend that she most strongly consider ureteroscopic laser lithotripsy. We reviewed the potential risks complications, and the expected recovery process associated with this procedure. She and her daughter gave informed consent to proceed.   A  catheterized specimen for culture will be obtained today. Her renal function will also be checked today.   Cc: Dr. Merrily Pew     * Signed by Raynelle Bring, M.D. on 04/30/18 at 5:33 PM (EDT)*

## 2018-05-13 ENCOUNTER — Ambulatory Visit (HOSPITAL_COMMUNITY): Payer: Medicare HMO

## 2018-05-13 ENCOUNTER — Ambulatory Visit (HOSPITAL_COMMUNITY): Payer: Medicare HMO | Admitting: Anesthesiology

## 2018-05-13 ENCOUNTER — Encounter (HOSPITAL_COMMUNITY)
Admission: RE | Disposition: A | Discharge status: Home / Self Care | Payer: Self-pay | Source: Ambulatory Visit | Attending: Urology

## 2018-05-13 ENCOUNTER — Encounter (HOSPITAL_COMMUNITY): Payer: Self-pay | Admitting: *Deleted

## 2018-05-13 ENCOUNTER — Ambulatory Visit (HOSPITAL_COMMUNITY)
Admission: RE | Admit: 2018-05-13 | Discharge: 2018-05-13 | Disposition: A | Discharge status: Home / Self Care | Payer: Medicare HMO | Source: Ambulatory Visit | Attending: Urology | Admitting: Urology

## 2018-05-13 ENCOUNTER — Other Ambulatory Visit: Payer: Self-pay

## 2018-05-13 DIAGNOSIS — N201 Calculus of ureter: Secondary | ICD-10-CM | POA: Diagnosis not present

## 2018-05-13 DIAGNOSIS — F419 Anxiety disorder, unspecified: Secondary | ICD-10-CM | POA: Diagnosis not present

## 2018-05-13 DIAGNOSIS — J45909 Unspecified asthma, uncomplicated: Secondary | ICD-10-CM | POA: Insufficient documentation

## 2018-05-13 DIAGNOSIS — N3080 Other cystitis without hematuria: Secondary | ICD-10-CM | POA: Insufficient documentation

## 2018-05-13 DIAGNOSIS — Z79899 Other long term (current) drug therapy: Secondary | ICD-10-CM | POA: Diagnosis not present

## 2018-05-13 DIAGNOSIS — I10 Essential (primary) hypertension: Secondary | ICD-10-CM | POA: Insufficient documentation

## 2018-05-13 DIAGNOSIS — Z79890 Hormone replacement therapy (postmenopausal): Secondary | ICD-10-CM | POA: Insufficient documentation

## 2018-05-13 DIAGNOSIS — F329 Major depressive disorder, single episode, unspecified: Secondary | ICD-10-CM | POA: Diagnosis not present

## 2018-05-13 DIAGNOSIS — K219 Gastro-esophageal reflux disease without esophagitis: Secondary | ICD-10-CM | POA: Diagnosis not present

## 2018-05-13 HISTORY — DX: Pneumonia, unspecified organism: J18.9

## 2018-05-13 HISTORY — PX: CYSTOSCOPY/URETEROSCOPY/HOLMIUM LASER/STENT PLACEMENT: SHX6546

## 2018-05-13 LAB — CBC
HCT: 37.8 % (ref 36.0–46.0)
HEMOGLOBIN: 12.2 g/dL (ref 12.0–15.0)
MCH: 29.3 pg (ref 26.0–34.0)
MCHC: 32.3 g/dL (ref 30.0–36.0)
MCV: 90.6 fL (ref 78.0–100.0)
PLATELETS: 227 10*3/uL (ref 150–400)
RBC: 4.17 MIL/uL (ref 3.87–5.11)
RDW: 13.6 % (ref 11.5–15.5)
WBC: 4.9 10*3/uL (ref 4.0–10.5)

## 2018-05-13 LAB — BASIC METABOLIC PANEL
Anion gap: 10 (ref 5–15)
BUN: 16 mg/dL (ref 8–23)
CHLORIDE: 107 mmol/L (ref 98–111)
CO2: 23 mmol/L (ref 22–32)
Calcium: 9.8 mg/dL (ref 8.9–10.3)
Creatinine, Ser: 0.89 mg/dL (ref 0.44–1.00)
GFR, EST NON AFRICAN AMERICAN: 58 mL/min — AB (ref >60)
Glucose, Bld: 93 mg/dL (ref 70–99)
POTASSIUM: 4.3 mmol/L (ref 3.5–5.1)
SODIUM: 140 mmol/L (ref 135–145)

## 2018-05-13 SURGERY — CYSTOSCOPY/URETEROSCOPY/HOLMIUM LASER/STENT PLACEMENT
Anesthesia: General | Site: Ureter | Laterality: Right

## 2018-05-13 MED ORDER — OXYCODONE HCL 5 MG PO TABS: 5.0000 mg | ORAL_TABLET | Freq: Once | ORAL | Status: DC | PRN

## 2018-05-13 MED ORDER — 0.9 % SODIUM CHLORIDE (POUR BTL) OPTIME
TOPICAL | Status: DC | PRN
  Administered 2018-05-13: 1000 mL

## 2018-05-13 MED ORDER — IOHEXOL 300 MG/ML  SOLN
INTRAMUSCULAR | Status: DC | PRN
  Administered 2018-05-13: 3 mL via URETHRAL

## 2018-05-13 MED ORDER — OXYCODONE HCL 5 MG/5ML PO SOLN
5.0000 mg | Freq: Once | ORAL | Status: DC | PRN
  Filled 2018-05-13: qty 5

## 2018-05-13 MED ORDER — PROMETHAZINE HCL 25 MG/ML IJ SOLN: 6.2500 mg | INTRAMUSCULAR | Status: DC | PRN

## 2018-05-13 MED ORDER — FENTANYL CITRATE (PF) 100 MCG/2ML IJ SOLN
INTRAMUSCULAR | Status: AC
  Filled 2018-05-13: qty 2

## 2018-05-13 MED ORDER — FENTANYL CITRATE (PF) 100 MCG/2ML IJ SOLN
INTRAMUSCULAR | Status: DC | PRN
  Administered 2018-05-13: 25 ug via INTRAVENOUS

## 2018-05-13 MED ORDER — ONDANSETRON HCL 4 MG/2ML IJ SOLN
INTRAMUSCULAR | Status: AC
  Filled 2018-05-13: qty 2

## 2018-05-13 MED ORDER — LACTATED RINGERS IV SOLN
INTRAVENOUS | Status: DC
  Administered 2018-05-13: 12:00:00 via INTRAVENOUS

## 2018-05-13 MED ORDER — HYDROMORPHONE HCL 1 MG/ML IJ SOLN: 0.2500 mg | INTRAMUSCULAR | Status: DC | PRN

## 2018-05-13 MED ORDER — PROPOFOL 10 MG/ML IV BOLUS
INTRAVENOUS | Status: AC
  Filled 2018-05-13: qty 20

## 2018-05-13 MED ORDER — LIDOCAINE 2% (20 MG/ML) 5 ML SYRINGE
INTRAMUSCULAR | Status: DC | PRN
  Administered 2018-05-13: 100 mg via INTRAVENOUS

## 2018-05-13 MED ORDER — SODIUM CHLORIDE 0.9 % IV SOLN
2.0000 g | Freq: Once | INTRAVENOUS | Status: AC
  Administered 2018-05-13: 2 g via INTRAVENOUS
  Filled 2018-05-13: qty 20

## 2018-05-13 MED ORDER — STERILE WATER FOR IRRIGATION IR SOLN
Status: DC | PRN
  Administered 2018-05-13: 1000 mL

## 2018-05-13 MED ORDER — DEXAMETHASONE SODIUM PHOSPHATE 10 MG/ML IJ SOLN
INTRAMUSCULAR | Status: DC | PRN
  Administered 2018-05-13: 10 mg via INTRAVENOUS

## 2018-05-13 MED ORDER — LIDOCAINE 2% (20 MG/ML) 5 ML SYRINGE
INTRAMUSCULAR | Status: AC
  Filled 2018-05-13: qty 5

## 2018-05-13 MED ORDER — PROPOFOL 10 MG/ML IV BOLUS
INTRAVENOUS | Status: DC | PRN
  Administered 2018-05-13: 150 mg via INTRAVENOUS

## 2018-05-13 MED ORDER — ONDANSETRON HCL 4 MG/2ML IJ SOLN
INTRAMUSCULAR | Status: DC | PRN
  Administered 2018-05-13: 4 mg via INTRAVENOUS

## 2018-05-13 MED ORDER — DEXAMETHASONE SODIUM PHOSPHATE 10 MG/ML IJ SOLN
INTRAMUSCULAR | Status: AC
  Filled 2018-05-13: qty 1

## 2018-05-13 SURGICAL SUPPLY — 20 items
BAG URO CATCHER STRL LF (MISCELLANEOUS) ×3 IMPLANT
BASKET ZERO TIP NITINOL 2.4FR (BASKET) IMPLANT
CATH INTERMIT  6FR 70CM (CATHETERS) IMPLANT
CLOTH BEACON ORANGE TIMEOUT ST (SAFETY) ×3 IMPLANT
COVER FOOTSWITCH UNIV (MISCELLANEOUS) IMPLANT
COVER SURGICAL LIGHT HANDLE (MISCELLANEOUS) ×3 IMPLANT
FIBER LASER FLEXIVA 365 (UROLOGICAL SUPPLIES) IMPLANT
FIBER LASER TRAC TIP (UROLOGICAL SUPPLIES) IMPLANT
GLOVE BIOGEL M STRL SZ7.5 (GLOVE) ×3 IMPLANT
GOWN STRL REUS W/TWL LRG LVL3 (GOWN DISPOSABLE) ×6 IMPLANT
GUIDEWIRE ANG ZIPWIRE 038X150 (WIRE) IMPLANT
GUIDEWIRE STR DUAL SENSOR (WIRE) ×3 IMPLANT
IV NS 1000ML (IV SOLUTION) ×2
IV NS 1000ML BAXH (IV SOLUTION) ×1 IMPLANT
MANIFOLD NEPTUNE II (INSTRUMENTS) ×3 IMPLANT
PACK CYSTO (CUSTOM PROCEDURE TRAY) ×3 IMPLANT
SHEATH URETERAL 12FRX35CM (MISCELLANEOUS) IMPLANT
TUBING CONNECTING 10 (TUBING) ×2 IMPLANT
TUBING CONNECTING 10' (TUBING) ×1
TUBING UROLOGY SET (TUBING) ×3 IMPLANT

## 2018-05-13 NOTE — Anesthesia Procedure Notes (Signed)
Procedure Name: LMA Insertion Date/Time: 05/13/2018 3:01 PM Performed by: Dione Booze, CRNA Pre-anesthesia Checklist: Suction available, Emergency Drugs available, Patient identified and Patient being monitored Patient Re-evaluated:Patient Re-evaluated prior to induction Oxygen Delivery Method: Circle system utilized Preoxygenation: Pre-oxygenation with 100% oxygen Induction Type: IV induction Ventilation: Mask ventilation without difficulty LMA Size: 4.0 Number of attempts: 1 Placement Confirmation: positive ETCO2 and breath sounds checked- equal and bilateral Tube secured with: Tape Dental Injury: Teeth and Oropharynx as per pre-operative assessment

## 2018-05-13 NOTE — Discharge Instructions (Signed)
1. You may see some blood in the urine and may have some burning with urination for 48-72 hours. You also may notice that you have to urinate more frequently or urgently after your procedure which is normal.  °2. You should call should you develop an inability urinate, fever > 101, persistent nausea and vomiting that prevents you from eating or drinking to stay hydrated.  °

## 2018-05-13 NOTE — Anesthesia Postprocedure Evaluation (Signed)
Anesthesia Post Note  Patient: [name]  Procedure(s) Performed: CYSTOSCOPY/RETROGRADE/URETEROSCOPY/BLADDER BIOPSY (Right Ureter)     Patient location during evaluation: PACU Anesthesia Type: General Level of consciousness: awake and alert Pain management: pain level controlled Vital Signs Assessment: post-procedure vital signs reviewed and stable Respiratory status: spontaneous breathing, nonlabored ventilation and respiratory function stable Cardiovascular status: blood pressure returned to baseline and stable Postop Assessment: no apparent nausea or vomiting Anesthetic complications: no    Last Vitals:  Vitals:   05/13/18 1645 05/13/18 1703  BP: 136/66 (!) 152/69  Pulse: (!) 54 70  Resp: 14 14  Temp: 36.4 C   SpO2: 100% 96%    Last Pain:  Vitals:   05/13/18 1703  TempSrc:   PainSc: 0-No pain                 Lynda Rainwater

## 2018-05-13 NOTE — Op Note (Signed)
Preoperative diagnosis: Right distal ureteral calculus  Postoperative diagnosis: History of right ureteral calculus, bladder tumor  Procedures: 1.  Cystoscopy 2.  Right retrograde pyelography with interpretation 3.  Right ureteroscopy 4.  Bladder biopsy with fulguration  Surgeon: Pryor Curia MD  Anesthesia: General  Complications: None  EBL: Minimal  Intraoperative findings: Right retrograde pyelography was performed with Omnipaque contrast via a 6 French ureteral catheter.  This demonstrated no obvious filling defects or other abnormalities.  Cystoscopy did demonstrate a small less than 1 cm bladder tumor to the left of the trigone.  Specimens: Bladder biopsies  Disposition of specimens: Pathology  Indication: Ms. Provencio is an 84 year old female who presented with a large 9 mm right distal UVJ calculus.  She and her daughter did wish to avoid surgical intervention if possible.  She underwent medical expulsion therapy and follow-up to demonstrate a persistent calcification consistent with a large UVJ calculus.  It was therefore recommended that she undergo elective ureteroscopic treatment.  She did not note passing a stone and presented to the operating room for definitive treatment today.  We had reviewed the potential risks, complications, and the expected recovery process.  Informed consent was obtained.  Description of procedure: The patient was taken to the operating room and a general anesthetic was administered.  She was given preoperative antibiotics, placed in the dorsolithotomy position, and prepped and draped in the usual sterile fashion.  Next, a preoperative timeout was performed.  Cystourethroscopy was performed.  This demonstrated a normal urethra.  Examination the bladder was then performed systematically and did reveal a small less than 1 cm tumor growth along the left hemitrigone just medial to the left ureteral orifice.  No other bladder tumors, stones, or  other mucosal pathology was noted within the bladder.  Attention then turned to the right ureteral orifice.  This was intubated with a 6 French ureteral catheter and Omnipaque contrast was injected.  This did not demonstrate any obvious abnormalities.  Due to the fact that the patient did have a fairly large stone and did not note passing a stone, the 6 French semirigid ureteroscope was passed up into the right ureter next to a safety guidewire to ensure that no stone was present.  This confirmed no evidence of a stone within the distal right ureter.  It was felt that she likely had passed her stone or that this calcification possibly could have represented a phlebolith adjacent to the ureterovesical junction.  Attention then returned to the bladder.  The patient's bladder tumor was identified and was biopsied using a cold cup biopsy forcep.  Fulguration was then performed with a Bugbee electrode.  This resulted in excellent hemostasis.  The bladder was emptied and then reinspected.  The procedure was ended.  She tolerated the procedure well without complications.  She was able to be transferred to the recovery unit in satisfactory condition.

## 2018-05-13 NOTE — Transfer of Care (Signed)
Immediate Anesthesia Transfer of Care Note  Patient: [name] Goodman  Procedure(s) Performed: CYSTOSCOPY/RETROGRADE/URETEROSCOPY/BLADDER BIOPSY (Right Ureter)  Patient Location: PACU  Anesthesia Type:General  Level of Consciousness: awake, drowsy and patient cooperative  Airway & Oxygen Therapy: Patient Spontanous Breathing and Patient connected to face mask oxygen  Post-op Assessment: Report given to RN and Post -op Vital signs reviewed and stable  Post vital signs: Reviewed  Last Vitals:  Vitals Value Taken Time  BP 161/72 05/13/2018  3:45 PM  Temp    Pulse 53 05/13/2018  3:46 PM  Resp 16 05/13/2018  3:46 PM  SpO2 100 % 05/13/2018  3:46 PM  Vitals shown include unvalidated device data.  Last Pain:  Vitals:   05/13/18 1114  TempSrc: Oral         Complications: No apparent anesthesia complications

## 2018-05-13 NOTE — Anesthesia Preprocedure Evaluation (Signed)
Anesthesia Evaluation  Patient identified by MRN, date of birth, ID band Patient awake    Reviewed: Allergy & Precautions, H&P , NPO status , Patient's Chart, lab work & pertinent test results  Airway Mallampati: II       Dental  (+) Edentulous Upper, Edentulous Lower   Pulmonary asthma ,    breath sounds clear to auscultation       Cardiovascular hypertension, Pt. on medications  Rhythm:Regular Rate:Normal     Neuro/Psych PSYCHIATRIC DISORDERS Anxiety Depression    GI/Hepatic GERD  Medicated,  Endo/Other    Renal/GU      Musculoskeletal   Abdominal   Peds  Hematology   Anesthesia Other Findings   Reproductive/Obstetrics                             Anesthesia Physical  Anesthesia Plan  ASA: III  Anesthesia Plan: General   Post-op Pain Management:    Induction: Intravenous  PONV Risk Score and Plan: 3 and Ondansetron and Midazolam  Airway Management Planned: LMA  Additional Equipment:   Intra-op Plan:   Post-operative Plan: Extubation in OR  Informed Consent: I have reviewed the patients History and Physical, chart, labs and discussed the procedure including the risks, benefits and alternatives for the proposed anesthesia with the patient or authorized representative who has indicated his/her understanding and acceptance.   Dental advisory given  Plan Discussed with:   Anesthesia Plan Comments:         Anesthesia Quick Evaluation

## 2018-05-13 NOTE — Interval H&P Note (Signed)
History and Physical Interval Note:  05/13/2018 1:31 PM  [name] Goodman  has presented today for surgery, with the diagnosis of RIGHT URETERAL CALCULUS  The various methods of treatment have been discussed with the patient and family. After consideration of risks, benefits and other options for treatment, the patient has consented to  Procedure(s): CYSTOSCOPY/RETROGRADE/URETEROSCOPY/HOLMIUM LASER/STENT PLACEMENT (Right) as a surgical intervention .  The patient's history has been reviewed, patient examined, no change in status, stable for surgery.  I have reviewed the patient's chart and labs.  Questions were answered to the patient's satisfaction.     Delvin Hedeen,LES

## 2018-05-14 ENCOUNTER — Encounter (HOSPITAL_COMMUNITY): Payer: Self-pay | Admitting: Urology

## 2018-06-01 ENCOUNTER — Encounter (HOSPITAL_COMMUNITY): Payer: Self-pay | Admitting: Emergency Medicine

## 2018-06-01 ENCOUNTER — Emergency Department (HOSPITAL_COMMUNITY)
Admission: EM | Admit: 2018-06-01 | Discharge: 2018-06-01 | Disposition: A | Discharge status: Home / Self Care | Payer: Medicare HMO | Attending: Emergency Medicine | Admitting: Emergency Medicine

## 2018-06-01 ENCOUNTER — Other Ambulatory Visit: Payer: Self-pay

## 2018-06-01 DIAGNOSIS — Z79899 Other long term (current) drug therapy: Secondary | ICD-10-CM | POA: Insufficient documentation

## 2018-06-01 DIAGNOSIS — I10 Essential (primary) hypertension: Secondary | ICD-10-CM | POA: Insufficient documentation

## 2018-06-01 DIAGNOSIS — R229 Localized swelling, mass and lump, unspecified: Secondary | ICD-10-CM

## 2018-06-01 DIAGNOSIS — R1907 Generalized intra-abdominal and pelvic swelling, mass and lump: Secondary | ICD-10-CM | POA: Diagnosis present

## 2018-06-01 DIAGNOSIS — J45909 Unspecified asthma, uncomplicated: Secondary | ICD-10-CM | POA: Diagnosis not present

## 2018-06-01 DIAGNOSIS — E039 Hypothyroidism, unspecified: Secondary | ICD-10-CM | POA: Insufficient documentation

## 2018-06-01 DIAGNOSIS — F0390 Unspecified dementia without behavioral disturbance: Secondary | ICD-10-CM | POA: Diagnosis not present

## 2018-06-01 HISTORY — DX: Disorder of kidney and ureter, unspecified: N28.9

## 2018-06-01 NOTE — Discharge Instructions (Addendum)
Watch for redness or increased swelling.  Watch for swelling other spots.  Follow-up with her primary care doctor for further management.

## 2018-06-01 NOTE — ED Provider Notes (Signed)
[facility] EMERGENCY DEPARTMENT Provider Note   CSN: [id number] Arrival date & time: 06/01/18  1409     History   Chief Complaint Chief Complaint  Patient presents with  . Post-op Problem    HPI [name] Goodman is a 84 y.o. female.  HPI Patient presents with swelling in her left flank area.  No pain.  Caregiver noticed the swelling this morning.  No pain no fevers no redness.  Has not had swelling like this before.  No other swellings on her body.  Around 2-1/2 weeks ago had some urologic procedures involving bladder and then right side.  Is really not had swelling like this before. Past Medical History:  Diagnosis Date  . Anxiety   . Arthritis   . Asthma   . Cancer [facility]) 2010   Dr Arnoldo Morale, Stage I rectal cancer  . Dementia   . Depression   . GERD (gastroesophageal reflux disease)   . Hypertension   . Hypothyroidism   . Pneumonia    hx of years ago   . Renal disorder     Patient Active Problem List   Diagnosis Date Noted  . Orthostatic hypotension 11/02/2017  . Syncope 11/01/2017  . Hypothyroidism 11/01/2017  . Depression 11/01/2017  . Dementia with behavioral disturbance 09/10/2017  . Dementia 05/31/2017  . GASTROINTESTINAL HEMORRHAGE, HX OF 04/28/2009  . HYPERTENSION 04/26/2009  . ARTHRITIS 04/26/2009  . Malignant neoplasm of rectum ([place]) 11/10/2008    Past Surgical History:  Procedure Laterality Date  . CATARACT EXTRACTION Left   . CATARACT EXTRACTION W/PHACO Right 05/12/2013   Procedure: CATARACT EXTRACTION PHACO AND INTRAOCULAR LENS PLACEMENT (IOC);  Surgeon: Tonny Branch, MD;  Location: [facility] ORS;  Service: Ophthalmology;  Laterality: Right;  CDE 25.23  . COLON RESECTION  2010   rectal cancer  . COLONOSCOPY N/A 09/16/2013   Procedure: COLONOSCOPY;  Surgeon: Danie Binder, MD;  Location: [facility] ENDO SUITE;  Service: Endoscopy;  Laterality: N/A;  9:45-moved to 955  . CYSTOSCOPY/URETEROSCOPY/HOLMIUM LASER/STENT PLACEMENT Right 05/13/2018   Procedure:  CYSTOSCOPY/RETROGRADE/URETEROSCOPY/BLADDER BIOPSY;  Surgeon: Raynelle Bring, MD;  Location: [facility] ORS;  Service: Urology;  Laterality: Right;  . ESOPHAGOGASTRODUODENOSCOPY  03/08/2009   FWY:OVZCHY esophagus, small hiatal hernia, otherwise normal  . Ileocolonoscopy  03/08/2009   IFO:YDXA surgical scar present in rectal mucosa with polypoid lesion,overlying it as described above, status post a clean resection/Pan colonic diverticula, remainder of colonic mucosa appeared  . KIDNEY STONE SURGERY       OB History   None      Home Medications    Prior to Admission medications   Medication Sig Start Date End Date Taking? Authorizing Provider  acetaminophen (TYLENOL) 325 MG tablet Take 650 mg by mouth every 6 (six) hours as needed for mild pain, moderate pain or headache.    [provider]  albuterol (PROVENTIL HFA;VENTOLIN HFA) 108 (90 BASE) MCG/ACT inhaler Inhale 2 puffs into the lungs every 6 (six) hours as needed for wheezing or shortness of breath.    [provider]  albuterol (PROVENTIL) (2.5 MG/3ML) 0.083% nebulizer solution Take 2.5 mg by nebulization every 6 (six) hours as needed for wheezing or shortness of breath.    [provider]  amLODipine (NORVASC) 5 MG tablet Take 5 mg by mouth daily.    [provider]  atorvastatin (LIPITOR) 10 MG tablet Take 10 mg by mouth every evening. 02/24/18   [provider]  HYDROcodone-acetaminophen (NORCO/VICODIN) 5-325 MG tablet Take 1 tablet by mouth every  6 (six) hours as needed for severe pain. Patient not taking: Reported on 05/09/2018 03/07/18   Mesner, Corene Cornea, MD  levothyroxine (SYNTHROID, LEVOTHROID) 50 MCG tablet Take 50 mcg by mouth daily before breakfast.  03/21/17   [provider]  memantine (NAMENDA) 10 MG tablet Take 1 tablet (10 mg total) by mouth 2 (two) times daily. 05/31/17   Marcial Pacas, MD  QUEtiapine (SEROQUEL) 25 MG tablet TAKE 1 TABLET BY MOUTH EVERYDAY AT BEDTIME 04/03/18   Marcial Pacas, MD  ranitidine (ZANTAC) 150 MG capsule Take 150 mg by mouth every evening. 01/25/18 07/24/18  [provider]  sertraline (ZOLOFT) 50 MG tablet Take 50 mg by mouth daily.  05/10/16   [provider]  tamsulosin (FLOMAX) 0.4 MG CAPS capsule Take 1 capsule (0.4 mg total) by mouth daily. 03/07/18   Mesner, Corene Cornea, MD  vitamin B-12 500 MCG tablet Take 1 tablet (500 mcg total) by mouth daily. 11/03/17   Orson Eva, MD  Vitamin D, Ergocalciferol, (DRISDOL) 50000 units CAPS capsule Take 50,000 Units by mouth once a week. Saturdays    [provider]    Family History Family History  Problem Relation Age of Onset  . Dementia Mother   . Dementia Father   . Diabetes Sister   . Colon cancer Neg Hx     Social History Social History   Tobacco Use  . Smoking status: Never Smoker  . Smokeless tobacco: Never Used  Substance Use Topics  . Alcohol use: No  . Drug use: No     Allergies   Donepezil   Review of Systems Review of Systems  Constitutional: Negative for appetite change, chills and fever.  Respiratory: Negative for shortness of breath.   Gastrointestinal: Negative for abdominal pain.  Genitourinary: Negative for flank pain.  Musculoskeletal: Negative for back pain.       Left flank soft tissue swelling.  Skin: Negative for wound.  Neurological: Negative for weakness.  Psychiatric/Behavioral: Positive for confusion.     Physical Exam Updated Vital Signs BP 123/65 (BP Location: Right Arm)   Pulse 80   Temp (!) 97.5 F (36.4 C) (Oral)   Resp 18   Ht 5\' 2"  (1.575 m)   Wt 52.3 kg   SpO2 95%   BMI 21.09 kg/m   Physical Exam  Constitutional: She appears well-developed and well-nourished.  HENT:  Head: Atraumatic.  Cardiovascular: Normal rate.  Pulmonary/Chest: Effort normal.  Abdominal: There is no tenderness.  Musculoskeletal:  Left posterior flank area has some soft tissue swelling.  No firmness but somewhat fluctuant.  No erythema.   No drainage.  Approximately over 10 cm tall and 6 or 7 cm wide.  Neurological: She is alert.  Skin: Skin is warm.     ED Treatments / Results  Labs (all labs ordered are listed, but only abnormal results are displayed) Labs Reviewed - No data to display  EKG None  Radiology No results found.  Procedures Procedures (including critical care time)  Medications Ordered in ED Medications - No data to display   Initial Impression / Assessment and Plan / ED Course  I have reviewed the triage vital signs and the nursing notes.  Pertinent labs & imaging results that were available during my care of the patient were reviewed by me and considered in my medical decision making (see chart for details).     Patient with soft tissue swelling in left posterior flank area.  Nontender.  Doubt this is a result  of recent urologic procedures.  Well-appearing.  Discussed with patient about possibility of CT scan for further delineation of it right now.  However at this time we will defer that for outpatient follow-up.  Discharge home.  Will return for signs of infection or worsening of the swelling.  Final Clinical Impressions(s) / ED Diagnoses   Final diagnoses:  Soft tissue swelling    ED Discharge Orders    None       Davonna Belling, MD 06/01/18 [phone]

## 2018-06-01 NOTE — ED Triage Notes (Signed)
Patient c/o swelling to back. Patient had surgery 8/29 for kidney stone removal but when surgeon went to remove stone, no stone was found. Daughter states that this morning patient had swelling left flank area where surgery was done. Patient denies any pain in area.

## 2018-06-28 ENCOUNTER — Other Ambulatory Visit: Payer: Self-pay | Admitting: Neurology

## 2018-07-31 ENCOUNTER — Encounter: Payer: Self-pay | Admitting: Gastroenterology

## 2018-08-16 ENCOUNTER — Other Ambulatory Visit: Payer: Self-pay | Admitting: Neurology

## 2018-09-18 NOTE — Progress Notes (Deleted)
[facility]  PATIENT: [name] DOB: August 22, 1932   REASON FOR VISIT: *** HISTORY FROM:    HISTORY OF PRESENT ILLNESS: [name] Goodman is of 84 years old right-hand female, with her 2 daughters [name], Goodman, seen in refer by her primary care nurse practitioner Amador Cunas for evaluation of memory loss, initial evaluation was on May 31 2017.  I reviewed and summarized the referring note, she had past medical history of hypertension, hypothyroidism, on supplement, depression, history of stage I rectal cancer, was treated with surgery in 2010, no chemotherapy or radiation therapy is required, she presented with rectal bleeding at that time.  She retired from [employer], Nursing aide about 20 years ago, She was able to help her nephew, and family members, quit driving around 6190, after family found she has mild memory loss, she tends to repeat herself, tried to minimize her memory problem,  Her mother, sister, , brother and her daughter [name] Goodman at age 67 suffered dementia.  She now lives alone, independent of daily activities,family member check on her frequently,she complains of feeling anxious at evening time, hear people knock on the door, is afraid to open the door   Laboratory evaluation in September 2018:normal CMP with exception of mildly decreased creatinine 1.09,glucose 120, normal CBC,  UPDATE Sep 10 2017: Laboratory evaluation August 1222, normal ESR, folic acid, C-reactive protein, thyroid function test, vitamin D, ANA, RPR, low normal range vitamin B12, 233  I have personally reviewed MRI of brain September 2018, diffuse generalized atrophy mild supratentorium small vessel disease  She has less appetite, she drinks ensure sometimes, she also complains some GI side effect, she is taking namenda 80m bid, aricept 182mqday, she get frustrated easily, makeup ideas sometimes, 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 which she  has not done for many years  UPDATE Mar 12 2018:YY She is accompanied by [name] at clinical visit, continue has worsening memory loss, also has hallucinations, has increased difficulty to be taken care of at home,  REVIEW OF SYSTEMS: Full 14 system review of systems performed and notable only for those listed, all others are neg:  Constitutional: neg  Cardiovascular: neg Ear/Nose/Throat: neg  Skin: neg Eyes: neg Respiratory: neg Gastroitestinal: neg  Hematology/Lymphatic: neg  Endocrine: neg Musculoskeletal:neg Allergy/Immunology: neg Neurological: neg Psychiatric: neg Sleep : neg   ALLERGIES: Allergies  Allergen Reactions  . Donepezil Other (See Comments)    Weight loss    HOME MEDICATIONS: Outpatient Medications Prior to Visit  Medication Sig Dispense Refill  . acetaminophen (TYLENOL) 325 MG tablet Take 650 mg by mouth every 6 (six) hours as needed for mild pain, moderate pain or headache.    . albuterol (PROVENTIL HFA;VENTOLIN HFA) 108 (90 BASE) MCG/ACT inhaler Inhale 2 puffs into the lungs every 6 (six) hours as needed for wheezing or shortness of breath.    . Marland Kitchenlbuterol (PROVENTIL) (2.5 MG/3ML) 0.083% nebulizer solution Take 2.5 mg by nebulization every 6 (six) hours as needed for wheezing or shortness of breath.    . Marland KitchenmLODipine (NORVASC) 5 MG tablet Take 5 mg by mouth daily.    . Marland Kitchentorvastatin (LIPITOR) 10 MG tablet Take 10 mg by mouth every evening.  6  . HYDROcodone-acetaminophen (NORCO/VICODIN) 5-325 MG tablet Take 1 tablet by mouth every 6 (six) hours as needed for severe pain. (Patient not taking: Reported on 05/09/2018) 10 tablet 0  . levothyroxine (SYNTHROID, LEVOTHROID) 50 MCG tablet Take 50 mcg by mouth daily before  breakfast.   5  . memantine (NAMENDA) 10 MG tablet TAKE 1 TABLET BY MOUTH TWICE A DAY 60 tablet 11  . QUEtiapine (SEROQUEL) 25 MG tablet TAKE 1 TABLET BY MOUTH EVERYDAY AT BEDTIME 90 tablet 3  . ranitidine (ZANTAC) 150 MG capsule Take 150 mg  by mouth every evening.    . sertraline (ZOLOFT) 50 MG tablet Take 50 mg by mouth daily.     . tamsulosin (FLOMAX) 0.4 MG CAPS capsule Take 1 capsule (0.4 mg total) by mouth daily. 30 capsule 0  . vitamin B-12 500 MCG tablet Take 1 tablet (500 mcg total) by mouth daily.    . Vitamin D, Ergocalciferol, (DRISDOL) 50000 units CAPS capsule Take 50,000 Units by mouth once a week. Saturdays     No facility-administered medications prior to visit.     PAST MEDICAL HISTORY: Past Medical History:  Diagnosis Date  . Anxiety   . Arthritis   . Asthma   . Cancer [facility]) 2010   Dr Arnoldo Morale, Stage I rectal cancer  . Dementia   . Depression   . GERD (gastroesophageal reflux disease)   . Hypertension   . Hypothyroidism   . Pneumonia    hx of years ago   . Renal disorder     PAST SURGICAL HISTORY: Past Surgical History:  Procedure Laterality Date  . CATARACT EXTRACTION Left   . CATARACT EXTRACTION W/PHACO Right 05/12/2013   Procedure: CATARACT EXTRACTION PHACO AND INTRAOCULAR LENS PLACEMENT (IOC);  Surgeon: Tonny Branch, MD;  Location: [facility] ORS;  Service: Ophthalmology;  Laterality: Right;  CDE 25.23  . COLON RESECTION  2010   rectal cancer  . COLONOSCOPY N/A 09/16/2013   Procedure: COLONOSCOPY;  Surgeon: Danie Binder, MD;  Location: [facility];  Service: Endoscopy;  Laterality: N/A;  9:45-moved to 955  . CYSTOSCOPY/URETEROSCOPY/HOLMIUM LASER/STENT PLACEMENT Right 05/13/2018   Procedure: CYSTOSCOPY/RETROGRADE/URETEROSCOPY/BLADDER BIOPSY;  Surgeon: Raynelle Bring, MD;  Location: [facility];  Service: Urology;  Laterality: Right;  . ESOPHAGOGASTRODUODENOSCOPY  03/08/2009   YJE:HUDJSH esophagus, small hiatal hernia, otherwise normal  . Ileocolonoscopy  03/08/2009   FWY:OVZC surgical scar present in rectal mucosa with polypoid lesion,overlying it as described above, status post a clean resection/Pan colonic diverticula, remainder of colonic mucosa appeared  . KIDNEY STONE SURGERY      FAMILY  HISTORY: Family History  Problem Relation Age of Onset  . Dementia Mother   . Dementia Father   . Diabetes Sister   . Colon cancer Neg Hx     SOCIAL HISTORY: Social History   Socioeconomic History  . Marital status: Widowed    Spouse name: Not on file  . Number of children: 4  . Years of education: Not on file  . Highest education level: Not on file  Occupational History  . Not on file  Social Needs  . Financial resource strain: Not on file  . Food insecurity:    Worry: Not on file    Inability: Not on file  . Transportation needs:    Medical: Not on file    Non-medical: Not on file  Tobacco Use  . Smoking status: Never Smoker  . Smokeless tobacco: Never Used  Substance and Sexual Activity  . Alcohol use: No  . Drug use: No  . Sexual activity: Yes    Birth control/protection: Post-menopausal  Lifestyle  . Physical activity:    Days per week: Not on file    Minutes per session: Not on file  . Stress: Not on  file  Relationships  . Social connections:    Talks on phone: Not on file    Gets together: Not on file    Attends religious service: Not on file    Active member of club or organization: Not on file    Attends meetings of clubs or organizations: Not on file    Relationship status: Not on file  . Intimate partner violence:    Fear of current or ex partner: Not on file    Emotionally abused: Not on file    Physically abused: Not on file    Forced sexual activity: Not on file  Other Topics Concern  . Not on file  Social History Narrative  . Not on file     PHYSICAL EXAM  There were no vitals filed for this visit. There is no height or weight on file to calculate BMI.  Generalized: Well developed, in no acute distress  Head: normocephalic and atraumatic,. Oropharynx benign  Neck: Supple, no carotid bruits  Cardiac: Regular rate rhythm, no murmur  Musculoskeletal: No deformity   Neurological examination   Mentation: Alert oriented to time,  place, history taking. Attention span and concentration appropriate. Recent and remote memory intact.  Follows all commands speech and language fluent.   Cranial nerve II-XII: Fundoscopic exam reveals sharp disc margins.Pupils were equal round reactive to light extraocular movements were full, visual field were full on confrontational test. Facial sensation and strength were normal. hearing was intact to finger rubbing bilaterally. Uvula tongue midline. head turning and shoulder shrug were normal and symmetric.Tongue protrusion into cheek strength was normal. Motor: normal bulk and tone, full strength in the BUE, BLE, fine finger movements normal, no pronator drift. No focal weakness Sensory: normal and symmetric to light Goodman, pinprick, and  Vibration, proprioception  Coordination: finger-nose-finger, heel-to-shin bilaterally, no dysmetria Reflexes: Brachioradialis 2/2, biceps 2/2, triceps 2/2, patellar 2/2, Achilles 2/2, plantar responses were flexor bilaterally. Gait and Station: Rising up from seated position without assistance, normal stance,  moderate stride, good arm swing, smooth turning, able to perform tiptoe, and heel walking without difficulty. Tandem gait is steady  DIAGNOSTIC DATA (LABS, IMAGING, TESTING) - I reviewed patient records, labs, notes, testing and imaging myself where available.  Lab Results  Component Value Date   WBC 4.9 05/13/2018   HGB 12.2 05/13/2018   HCT 37.8 05/13/2018   MCV 90.6 05/13/2018   PLT 227 05/13/2018      Component Value Date/Time   NA 140 05/13/2018 1205   K 4.3 05/13/2018 1205   CL 107 05/13/2018 1205   CO2 23 05/13/2018 1205   GLUCOSE 93 05/13/2018 1205   BUN 16 05/13/2018 1205   CREATININE 0.89 05/13/2018 1205   CALCIUM 9.8 05/13/2018 1205   PROT 7.7 03/07/2018 1713   ALBUMIN 4.1 03/07/2018 1713   AST 29 03/07/2018 1713   ALT 14 03/07/2018 1713   ALKPHOS 87 03/07/2018 1713   BILITOT 1.4 (H) 03/07/2018 1713   GFRNONAA 58 (L)  05/13/2018 1205   GFRAA >60 05/13/2018 1205   No results found for: CHOL, HDL, LDLCALC, LDLDIRECT, TRIG, CHOLHDL No results found for: HGBA1C Lab Results  Component Value Date   VITAMINB12 150 (L) 11/02/2017   Lab Results  Component Value Date   TSH 3.559 11/02/2017    ***  ASSESSMENT AND PLAN  84 y.o. year old female  has a past medical history of Anxiety, Arthritis, Asthma, Cancer ([place]) (2010), Dementia, Depression, GERD (gastroesophageal reflux disease), Hypertension, Hypothyroidism, Pneumonia,  and Renal disorder. here with ***  Karita P Marston is a 84 y.o. female    Dementia with agitation             Mini-Mental Status Examination 18/30              Strong family history of dementia             Most consistent with central nervous system degenerative disorder             Continue Namenda 10 mg twice a day, Aricept 10 mg once a day was stopped due to weight loss, decreased appetite                      Seroquel 25 mg every night for palpitations   Dennie Bible, [facility], [facility], APRN  [facility] [address], [place], [place] [address] [phone]

## 2018-09-19 ENCOUNTER — Telehealth: Payer: Self-pay | Admitting: *Deleted

## 2018-09-19 ENCOUNTER — Ambulatory Visit: Payer: Medicare HMO | Admitting: Nurse Practitioner

## 2018-09-19 NOTE — Telephone Encounter (Signed)
Spoke with daughter, Talicia to reschedule follow up due to computers down today. Rescheduled for soonest which was in Feb and placed patient on wait list. Ahni verbalized understanding.

## 2018-12-09 ENCOUNTER — Ambulatory Visit: Payer: Medicare HMO | Admitting: Neurology

## 2018-12-09 ENCOUNTER — Encounter: Payer: Self-pay | Admitting: Neurology

## 2018-12-09 ENCOUNTER — Ambulatory Visit: Payer: Self-pay | Admitting: Nurse Practitioner

## 2018-12-09 VITALS — BP 147/82 | HR 68 | Ht 62.0 in | Wt 126.0 lb

## 2018-12-09 DIAGNOSIS — F0390 Unspecified dementia without behavioral disturbance: Secondary | ICD-10-CM | POA: Diagnosis not present

## 2018-12-09 MED ORDER — QUETIAPINE FUMARATE 50 MG PO TABS: 50.0000 mg | ORAL_TABLET | Freq: Every day | ORAL | 3 refills | Status: DC

## 2018-12-09 NOTE — Progress Notes (Addendum)
PATIENT: [name] DOB: 1932-08-16  REASON FOR VISIT: follow up HISTORY FROM: patient  HISTORY OF PRESENT ILLNESS: Today 12/09/18  HISTORY  Isella P Schulte is of 85 years old right-hand female, with her 2 daughters Gem, Conkle, seen in refer by her primary care nurse practitioner Amador Cunas for evaluation of memory loss, initial evaluation was on May 31 2017.  I reviewed and summarized the referring note, she had past medical history of hypertension, hypothyroidism, on supplement, depression, history of stage I rectal cancer, was treated with surgery in 2010, no chemotherapy or radiation therapy is required, she presented with rectal bleeding at that time.  She retired from Catering manager, Nursing aide about 20 years ago, She was able to help her nephew, and family members, quit driving around 6010, after family found she has mild memory loss, she tends to repeat herself, tried to minimize her memory problem,  Her mother, sister, , brother and her daughter [name] at age 62 suffered dementia.  She now lives alone, independent of daily activities,family member check on her frequently,she complains of feeling anxious at evening time, hear people knock on the door, is afraid to open the door   Laboratory evaluation in September 2018:normal CMP with exception of mildly decreased creatinine 1.09,glucose 120, normal CBC,  UPDATE Sep 10 2017: Laboratory evaluation August 9323, normal ESR, folic acid, C-reactive protein, thyroid function test, vitamin D, ANA, RPR, low normal range vitamin B12, 233  I have personally reviewed MRI of brain September 2018, diffuse generalized atrophy mild supratentorium small vessel disease  She has less appetite, she drinks ensure sometimes, she also complains some GI side effect, she is taking namenda 31m bid, aricept 151mqday, she get frustrated easily, makeup ideas sometimes, 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 which she  has not done for many years  UPDATE Mar 12 2018: She is accompanied by [name] at clinical visit, continue has worsening memory loss, also has hallucinations, has increased difficulty to be taken care of at home,  Update December 09, 2018 SS:  She is taking Namenda 10 mg twice a day.  Her Aricept was stopped by her primary care doctor due to weight loss and decreased appetite.  She was started on Seroquel 25 mg at night for palpitations in May 2019.  MMSE was 18/30 in May 2019.   She presents today for follow-up accompanied by her-2 daughters.  They report there has been no change in her memory.  She is tolerating the Seroquel at bedtime well and her daughter reports that she noticed an immediate improvement in her hallucinations and agitation.  [place] She requires assistance with all of her ADLs and her family prepares her meals.  She does have a good appetite.  She has not had any falls, however she is never left alone.  Her daughter reports they are having a hard time getting her to bathe regularly.  She also reports that she has become obsessed with finding a boyfriend.  Her daughter also reports that the patient has been showing some sexual behavior.  She has also been approaching men when out in public. The daughter, RuLenis her primary caregiver.   REVIEW OF SYSTEMS: Out of a complete 14 system review of symptoms, the patient complains only of the following symptoms, and all other reviewed systems are negative.  Runny nose, cold intolerance, insomnia, joint pain, memory loss  ALLERGIES: Allergies  Allergen  Reactions  . Donepezil Other (See Comments)    Weight loss    HOME MEDICATIONS: Outpatient Medications Prior to Visit  Medication Sig Dispense Refill  . acetaminophen (TYLENOL) 325 MG tablet Take 650 mg by mouth every 6 (six) hours as needed for mild pain, moderate pain or headache.    . albuterol (PROVENTIL HFA;VENTOLIN HFA) 108 (90  BASE) MCG/ACT inhaler Inhale 2 puffs into the lungs every 6 (six) hours as needed for wheezing or shortness of breath.    Marland Kitchen albuterol (PROVENTIL) (2.5 MG/3ML) 0.083% nebulizer solution Take 2.5 mg by nebulization every 6 (six) hours as needed for wheezing or shortness of breath.    Marland Kitchen amLODipine (NORVASC) 5 MG tablet Take 5 mg by mouth daily.    Marland Kitchen atorvastatin (LIPITOR) 10 MG tablet Take 10 mg by mouth every evening.  6  . HYDROcodone-acetaminophen (NORCO/VICODIN) 5-325 MG tablet Take 1 tablet by mouth every 6 (six) hours as needed for severe pain. 10 tablet 0  . levothyroxine (SYNTHROID, LEVOTHROID) 50 MCG tablet Take 50 mcg by mouth daily before breakfast.   5  . memantine (NAMENDA) 10 MG tablet TAKE 1 TABLET BY MOUTH TWICE A DAY 60 tablet 11  . sertraline (ZOLOFT) 50 MG tablet Take 50 mg by mouth daily.     . tamsulosin (FLOMAX) 0.4 MG CAPS capsule Take 1 capsule (0.4 mg total) by mouth daily. 30 capsule 0  . vitamin B-12 500 MCG tablet Take 1 tablet (500 mcg total) by mouth daily.    . Vitamin D, Ergocalciferol, (DRISDOL) 50000 units CAPS capsule Take 50,000 Units by mouth once a week. Saturdays    . QUEtiapine (SEROQUEL) 25 MG tablet TAKE 1 TABLET BY MOUTH EVERYDAY AT BEDTIME 90 tablet 3  . ranitidine (ZANTAC) 150 MG capsule Take 150 mg by mouth every evening.     No facility-administered medications prior to visit.     PAST MEDICAL HISTORY: Past Medical History:  Diagnosis Date  . Anxiety   . Arthritis   . Asthma   . Cancer [facility]) 2010   Dr Arnoldo Morale, Stage I rectal cancer  . Dementia ([place])   . Depression   . GERD (gastroesophageal reflux disease)   . Hypertension   . Hypothyroidism   . Pneumonia    hx of years ago   . Renal disorder     PAST SURGICAL HISTORY: Past Surgical History:  Procedure Laterality Date  . CATARACT EXTRACTION Left   . CATARACT EXTRACTION W/PHACO Right 05/12/2013   Procedure: CATARACT EXTRACTION PHACO AND INTRAOCULAR LENS PLACEMENT (IOC);  Surgeon: Tonny Branch, MD;  Location: [facility];  Service: Ophthalmology;  Laterality: Right;  CDE 25.23  . COLON RESECTION  2010   rectal cancer  . COLONOSCOPY N/A 09/16/2013   Procedure: COLONOSCOPY;  Surgeon: Danie Binder, MD;  Location: [facility];  Service: Endoscopy;  Laterality: N/A;  9:45-moved to 955  . CYSTOSCOPY/URETEROSCOPY/HOLMIUM LASER/STENT PLACEMENT Right 05/13/2018   Procedure: CYSTOSCOPY/RETROGRADE/URETEROSCOPY/BLADDER BIOPSY;  Surgeon: Raynelle Bring, MD;  Location: [facility];  Service: Urology;  Laterality: Right;  . ESOPHAGOGASTRODUODENOSCOPY  03/08/2009   LPF:[id number] esophagus, small hiatal hernia, otherwise normal  . Ileocolonoscopy  03/08/2009   XBD:ZHGD surgical scar present in rectal mucosa with polypoid lesion,overlying it as described above, status post a clean resection/Pan colonic diverticula, remainder of colonic mucosa appeared  . KIDNEY STONE SURGERY      FAMILY HISTORY: Family History  Problem Relation Age of Onset  . Dementia Mother   . Dementia Father   .  Diabetes Sister   . Colon cancer Neg Hx     SOCIAL HISTORY: Social History   Socioeconomic History  . Marital status: Widowed    Spouse name: Not on file  . Number of children: 4  . Years of education: Not on file  . Highest education level: Not on file  Occupational History  . Not on file  Social Needs  . Financial resource strain: Not on file  . Food insecurity:    Worry: Not on file    Inability: Not on file  . Transportation needs:    Medical: Not on file    Non-medical: Not on file  Tobacco Use  . Smoking status: Never Smoker  . Smokeless tobacco: Never Used  Substance and Sexual Activity  . Alcohol use: No  . Drug use: No  . Sexual activity: Yes    Birth control/protection: Post-menopausal  Lifestyle  . Physical activity:    Days per week: Not on file    Minutes per session: Not on file  . Stress: Not on file  Relationships  . Social connections:    Talks on phone: Not on file    Gets  together: Not on file    Attends religious service: Not on file    Active member of club or organization: Not on file    Attends meetings of clubs or organizations: Not on file    Relationship status: Not on file  . Intimate partner violence:    Fear of current or ex partner: Not on file    Emotionally abused: Not on file    Physically abused: Not on file    Forced sexual activity: Not on file  Other Topics Concern  . Not on file  Social History Narrative  . Not on file      PHYSICAL EXAM  Vitals:   12/09/18 1514  BP: (!) 147/82  Pulse: 68  Weight: 126 lb (57.2 kg)  Height: _0  (1.575 m)   Body mass index is 23.05 kg/m.  Generalized: Well developed, in no acute distress   Neurological examination  Mentation: Alert, able to provide some history, converses during exam. Able to follow most commands Cranial nerve II-XII: Pupils were equal round reactive to light. Extraocular movements were full, visual field were full on confrontational test. Facial sensation and strength were normal. Uvula tongue midline. Head turning and shoulder shrug  were normal and symmetric. Motor: The motor testing reveals 4 over 5 strength of all 4 extremities. Good symmetric motor tone is noted throughout.  Sensory: Sensory testing is intact to soft touch on all 4 extremities. No evidence of extinction is noted.  Coordination: Cerebellar testing reveals good finger-nose-finger bilaterally. Unable to follow command for heel to shin. Gait and station: Gait is normal. Tandem gait not attempted. Romberg is negative. No drift is seen.  Reflexes: Deep tendon reflexes are symmetric and decreased bilaterally   DIAGNOSTIC DATA (LABS, IMAGING, TESTING) - I reviewed patient records, labs, notes, testing and imaging myself where available.  Lab Results  Component Value Date   WBC 4.9 05/13/2018   HGB 12.2 05/13/2018   HCT 37.8 05/13/2018   MCV 90.6 05/13/2018   PLT 227 05/13/2018      Component Value  Date/Time   NA 140 05/13/2018 1205   K 4.3 05/13/2018 1205   CL 107 05/13/2018 1205   CO2 23 05/13/2018 1205   GLUCOSE 93 05/13/2018 1205   BUN 16 05/13/2018 1205   CREATININE 0.89 05/13/2018 1205  CALCIUM 9.8 05/13/2018 1205   PROT 7.7 03/07/2018 1713   ALBUMIN 4.1 03/07/2018 1713   AST 29 03/07/2018 1713   ALT 14 03/07/2018 1713   ALKPHOS 87 03/07/2018 1713   BILITOT 1.4 (H) 03/07/2018 1713   GFRNONAA 58 (L) 05/13/2018 1205   GFRAA >60 05/13/2018 1205   No results found for: CHOL, HDL, LDLCALC, LDLDIRECT, TRIG, CHOLHDL No results found for: HGBA1C Lab Results  Component Value Date   VITAMINB12 150 (L) 11/02/2017   Lab Results  Component Value Date   TSH 3.559 11/02/2017      ASSESSMENT AND PLAN 85 y.o. year old female  has a past medical history of Anxiety, Arthritis, Asthma, Cancer ([place]) (2010), Dementia ([place]), Depression, GERD (gastroesophageal reflux disease), Hypertension, Hypothyroidism, Pneumonia, and Renal disorder. here with:  1.  Dementia with behavioral disturbances  Since her last visit she has had a decline in her MMSE of 14/30.  She is also exhibiting sexual behavior that is bothersome and embarrassing to her daughter.  She is currently taking Seroquel 25 mg at bedtime for hallucinations and is tolerating this well.  Her daughter reported that she saw a good benefit right away when the medicine was started.  I talked with Dr. Krista Blue and we will increase the Seroquel to 50 mg at bedtime.  I discussed with the daughter we have to be careful with these medications especially in the elderly population.  However given the development of her sexual behavior we will try increasing her Seroquel to see if there is any benefit.  I did discuss at length with her daughter considering getting some in-home help to assist with bathing, personal care.  We discussed the potential for caregiver burnout.  I provided her with a dementia packet however some of the resources may not be  helpful as they live in [place].  We also discussed the possibility of having Ms. Lai moved to a memory care unit in the future.  She will continue taking Namenda. She will follow-up in 6 months or sooner if needed.    I spent 15 minutes with the patient. 50% of this time was spent discussing her plan of care and memory score.    Butler Denmark, AGNP-C, DNP 12/09/2018, 6:38 PM [facility] [address], [place], [place] [address] [phone]

## 2018-12-10 NOTE — Progress Notes (Signed)
I have reviewed and agreed above plan. 

## 2019-04-05 ENCOUNTER — Other Ambulatory Visit: Payer: Self-pay | Admitting: Neurology

## 2019-04-07 ENCOUNTER — Other Ambulatory Visit: Payer: Self-pay

## 2019-05-15 ENCOUNTER — Other Ambulatory Visit: Payer: Self-pay | Admitting: Neurology

## 2019-06-09 NOTE — Progress Notes (Deleted)
PATIENT: [name] Goodman DOB: 1931/12/17  REASON FOR VISIT: follow up HISTORY FROM: patient  HISTORY OF PRESENT ILLNESS: Today 06/09/19  HISTORY  HISTORY  [name] Goodman of 85 years old right-hand female, with her 2 daughters [name], [name], seen in refer by her primary care nurse practitioner Amador Cunas for evaluation of memory loss, initial evaluation was onAugust 16 2018.  I reviewed and summarized the referring note,she had past medical history of hypertension, hypothyroidism, on supplement, depression, history of stage I rectal cancer, was treated with surgery in 2010, no chemotherapy or radiation therapy is required, she presented with rectal bleeding at that time.  She retired from Catering manager, Nursing aide about 20 years ago, She was able to help her nephew, and family members, quit driving around 1224, after family found she has mild memory loss, she tends to repeat herself, tried to minimize her memory problem,  Her mother, sister, , brother and her daughter [name] Goodman at age 62 suffered dementia.  She now lives alone, independent of daily activities,family member check on her frequently,she complains of feeling anxious at evening time, hear people knock on the door, is afraid to open the door  Laboratory evaluation in September 2018:normal CMP with exception of mildly decreased creatinine 1.09,glucose 120, normal CBC,  UPDATE Sep 10 2017: Laboratory evaluation August 8250, normal ESR, folic acid, C-reactive protein, thyroid function test, vitamin D, ANA, RPR, low normal range vitamin B12, 233  I have personally reviewed MRI of brain September 2018, diffuse generalized atrophy mild supratentorium small vessel disease  She has less appetite, she drinks ensure sometimes, she also complains some GI side effect, she is taking namenda 52m bid, aricept 171mqday, she get frustrated easily, makeup ideas sometimes, 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  Her Aricept was stopped by her primary care doctor due to weight loss and decreased appetite.  She was started on Seroquel 25 mg at night for palpitations in May 2019.  MMSE was 18/30 in May 2019.   She presents today for follow-up accompanied by her-2 daughters.  They report there has been no change in her memory.  She is tolerating the Seroquel at bedtime well and her daughter reports that she noticed an immediate improvement in her hallucinations and agitation.  [place] and [place] She requires assistance with all of her ADLs and her family prepares her meals.  She does have a good appetite.  She has not had any falls, however she is never left alone.  Her daughter reports they are having a hard time getting her to bathe regularly.  She also reports that she has become obsessed with finding a boyfriend.  Her daughter also reports that the patient has been showing some sexual behavior.  She has also been approaching men when out in public. The daughter, [name] Goodman her primary caregiver.   Update June 10, 2019 SS:    REVIEW OF SYSTEMS: Out of a complete 14 system review of symptoms, the patient complains only of the following symptoms, and all other reviewed systems are negative.  ALLERGIES: Allergies  Allergen Reactions  .  Donepezil Other (See Comments)    Weight loss    HOME MEDICATIONS: Outpatient Medications Prior to Visit  Medication Sig Dispense Refill  . acetaminophen (TYLENOL) 325 MG tablet Take 650 mg by mouth every 6 (six) hours as needed for mild pain, moderate pain or headache.    . albuterol (PROVENTIL HFA;VENTOLIN HFA) 108 (90 BASE) MCG/ACT  inhaler Inhale 2 puffs into the lungs every 6 (six) hours as needed for wheezing or shortness of breath.    Marland Kitchen albuterol (PROVENTIL) (2.5 MG/3ML) 0.083% nebulizer solution Take 2.5 mg by nebulization every 6 (six) hours as needed for wheezing or shortness of breath.    Marland Kitchen amLODipine (NORVASC) 5 MG tablet Take 5 mg by mouth daily.    Marland Kitchen atorvastatin (LIPITOR) 10 MG tablet Take 10 mg by mouth every evening.  6  . HYDROcodone-acetaminophen (NORCO/VICODIN) 5-325 MG tablet Take 1 tablet by mouth every 6 (six) hours as needed for severe pain. 10 tablet 0  . levothyroxine (SYNTHROID, LEVOTHROID) 50 MCG tablet Take 50 mcg by mouth daily before breakfast.   5  . memantine (NAMENDA) 10 MG tablet TAKE 1 TABLET BY MOUTH TWICE A DAY 60 tablet 11  . QUEtiapine (SEROQUEL) 50 MG tablet TAKE 1 TABLET BY MOUTH EVERYDAY AT BEDTIME 90 tablet 0  . ranitidine (ZANTAC) 150 MG capsule Take 150 mg by mouth every evening.    . sertraline (ZOLOFT) 50 MG tablet Take 50 mg by mouth daily.     . tamsulosin (FLOMAX) 0.4 MG CAPS capsule Take 1 capsule (0.4 mg total) by mouth daily. 30 capsule 0  . vitamin B-12 500 MCG tablet Take 1 tablet (500 mcg total) by mouth daily.    . Vitamin D, Ergocalciferol, (DRISDOL) 50000 units CAPS capsule Take 50,000 Units by mouth once a week. Saturdays     No facility-administered medications prior to visit.     PAST MEDICAL HISTORY: Past Medical History:  Diagnosis Date  . Anxiety   . Arthritis   . Asthma   . Cancer [facility]) 2010   Dr Arnoldo Morale, Stage I rectal cancer  . Dementia ([place])   . Depression   . GERD (gastroesophageal reflux disease)   . Hypertension   . Hypothyroidism   . Pneumonia    hx of years ago   . Renal disorder     PAST SURGICAL HISTORY: Past Surgical History:  Procedure Laterality Date  . CATARACT EXTRACTION Left   . CATARACT EXTRACTION W/PHACO Right 05/12/2013   Procedure: CATARACT EXTRACTION PHACO AND INTRAOCULAR LENS PLACEMENT (IOC);  Surgeon: Tonny Branch, MD;   Location: [facility];  Service: Ophthalmology;  Laterality: Right;  CDE 25.23  . COLON RESECTION  2010   rectal cancer  . COLONOSCOPY N/A 09/16/2013   Procedure: COLONOSCOPY;  Surgeon: Danie Binder, MD;  Location: [facility];  Service: Endoscopy;  Laterality: N/A;  9:45-moved to 955  . CYSTOSCOPY/URETEROSCOPY/HOLMIUM LASER/STENT PLACEMENT Right 05/13/2018   Procedure: CYSTOSCOPY/RETROGRADE/URETEROSCOPY/BLADDER BIOPSY;  Surgeon: Raynelle Bring, MD;  Location: [facility];  Service: Urology;  Laterality: Right;  . ESOPHAGOGASTRODUODENOSCOPY  03/08/2009   JME:QASTMH esophagus, small hiatal hernia, otherwise normal  . Ileocolonoscopy  03/08/2009   DQQ:IWLN surgical scar present in rectal mucosa with polypoid lesion,overlying it as described above, status post a clean resection/Pan colonic diverticula, remainder of colonic mucosa appeared  . KIDNEY STONE SURGERY      FAMILY HISTORY: Family History  Problem Relation Age of Onset  . Dementia Mother   . Dementia Father   .  Diabetes Sister   . Colon cancer Neg Hx     SOCIAL HISTORY: Social History   Socioeconomic History  . Marital status: Widowed    Spouse name: Not on file  . Number of children: 4  . Years of education: Not on file  . Highest education level: Not on file  Occupational History  . Not on file  Social Needs  . Financial resource strain: Not on file  . Food insecurity    Worry: Not on file    Inability: Not on file  . Transportation needs    Medical: Not on file    Non-medical: Not on file  Tobacco Use  . Smoking status: Never Smoker  . Smokeless tobacco: Never Used  Substance and Sexual Activity  . Alcohol use: No  . Drug use: No  . Sexual activity: Yes    Birth control/protection: Post-menopausal  Lifestyle  . Physical activity    Days per week: Not on file    Minutes per session: Not on file  . Stress: Not on file  Relationships  . Social Herbalist on phone: Not on file    Gets together: Not on  file    Attends religious service: Not on file    Active member of club or organization: Not on file    Attends meetings of clubs or organizations: Not on file    Relationship status: Not on file  . Intimate partner violence    Fear of current or ex partner: Not on file    Emotionally abused: Not on file    Physically abused: Not on file    Forced sexual activity: Not on file  Other Topics Concern  . Not on file  Social History Narrative  . Not on file      PHYSICAL EXAM  There were no vitals filed for this visit. There is no height or weight on file to calculate BMI.  Generalized: Well developed, in no acute distress   Neurological examination  Mentation: Alert oriented to time, place, history taking. Follows all commands speech and language fluent Cranial nerve II-XII: Pupils were equal round reactive to light. Extraocular movements were full, visual field were full on confrontational test. Facial sensation and strength were normal. Uvula tongue midline. Head turning and shoulder shrug  were normal and symmetric. Motor: The motor testing reveals 5 over 5 strength of all 4 extremities. Good symmetric motor tone is noted throughout.  Sensory: Sensory testing is intact to soft touch on all 4 extremities. No evidence of extinction is noted.  Coordination: Cerebellar testing reveals good finger-nose-finger and heel-to-shin bilaterally.  Gait and station: Gait is normal. Tandem gait is normal. Romberg is negative. No drift is seen.  Reflexes: Deep tendon reflexes are symmetric and normal bilaterally.   DIAGNOSTIC DATA (LABS, IMAGING, TESTING) - I reviewed patient records, labs, notes, testing and imaging myself where available.  Lab Results  Component Value Date   WBC 4.9 05/13/2018   HGB 12.2 05/13/2018   HCT 37.8 05/13/2018   MCV 90.6 05/13/2018   PLT 227 05/13/2018      Component Value Date/Time   NA 140 05/13/2018 1205   K 4.3 05/13/2018 1205   CL 107 05/13/2018 1205    CO2 23 05/13/2018 1205   GLUCOSE 93 05/13/2018 1205   BUN 16 05/13/2018 1205   CREATININE 0.89 05/13/2018 1205   CALCIUM 9.8 05/13/2018 1205   PROT 7.7 03/07/2018 1713   ALBUMIN 4.1 03/07/2018 1713  AST 29 03/07/2018 1713   ALT 14 03/07/2018 1713   ALKPHOS 87 03/07/2018 1713   BILITOT 1.4 (H) 03/07/2018 1713   GFRNONAA 58 (L) 05/13/2018 1205   GFRAA >60 05/13/2018 1205   No results found for: CHOL, HDL, LDLCALC, LDLDIRECT, TRIG, CHOLHDL No results found for: HGBA1C Lab Results  Component Value Date   VITAMINB12 150 (L) 11/02/2017   Lab Results  Component Value Date   TSH 3.559 11/02/2017      ASSESSMENT AND PLAN 85 y.o. year old female  has a past medical history of Anxiety, Arthritis, Asthma, Cancer ([place]) (2010), Dementia ([place]), Depression, GERD (gastroesophageal reflux disease), Hypertension, Hypothyroidism, Pneumonia, and Renal disorder. here with ***   I spent 15 minutes with the patient. 50% of this time was spent   Butler Denmark, [place],  06/09/2019, 8:53 PM [facility] [address] [place], [place] [address] [phone]

## 2019-06-10 ENCOUNTER — Telehealth: Payer: Self-pay

## 2019-06-10 ENCOUNTER — Encounter: Payer: Self-pay | Admitting: Neurology

## 2019-06-10 ENCOUNTER — Ambulatory Visit: Payer: Medicare HMO | Admitting: Neurology

## 2019-06-10 NOTE — Telephone Encounter (Signed)
Patient was a no call/no show for their appointment today.   

## 2019-06-19 ENCOUNTER — Other Ambulatory Visit: Payer: Self-pay | Admitting: Neurology

## 2019-06-25 ENCOUNTER — Ambulatory Visit: Payer: Medicare HMO | Admitting: Neurology

## 2019-06-25 ENCOUNTER — Encounter: Payer: Self-pay | Admitting: Neurology

## 2019-06-25 ENCOUNTER — Other Ambulatory Visit: Payer: Self-pay

## 2019-06-25 VITALS — BP 152/84 | HR 64 | Temp 96.2°F | Ht 62.0 in | Wt 136.8 lb

## 2019-06-25 DIAGNOSIS — F0391 Unspecified dementia with behavioral disturbance: Secondary | ICD-10-CM | POA: Diagnosis not present

## 2019-06-25 MED ORDER — MEMANTINE HCL 10 MG PO TABS: 10.0000 mg | ORAL_TABLET | Freq: Two times a day (BID) | ORAL | 11 refills | Status: DC

## 2019-06-25 MED ORDER — QUETIAPINE FUMARATE 50 MG PO TABS: ORAL_TABLET | ORAL | 1 refills | Status: AC

## 2019-06-25 NOTE — Progress Notes (Signed)
PATIENT: [name] Goodman DOB: 1932-09-24  REASON FOR VISIT: follow up HISTORY FROM: patient  HISTORY OF PRESENT ILLNESS: Today 06/25/19  HISTORY  HISTORY  [name] Goodman of 85 years old right-hand female, with her 2 daughters [name] Goodman, [name] Goodman, seen in refer by her primary care nurse practitioner [name] Goodman for evaluation of memory loss, initial evaluation was onAugust 16 2018.  I reviewed and summarized the referring note,she had past medical history of hypertension, hypothyroidism, on supplement, depression, history of stage I rectal cancer, was treated with surgery in 2010, no chemotherapy or radiation therapy is required, she presented with rectal bleeding at that time.  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  She now lives alone, independent of daily activities,family member check on her frequently,she complains of feeling anxious at evening time, hear people knock on the door, is afraid to open the door  Laboratory evaluation in September 2018:normal CMP with exception of mildly decreased creatinine 1.09,glucose 120, normal CBC,  UPDATE Sep 10 2017: Laboratory evaluation August 3154, normal ESR, folic acid, C-reactive protein, thyroid function test, vitamin D, ANA, RPR, low normal range vitamin B12, 233  I have personally reviewed MRI of brain September 2018, diffuse generalized atrophy mild supratentorium small vessel disease  She has less appetite, she drinks ensure sometimes, she also complains some GI side effect, she is taking namenda 67m bid, aricept 151mqday, she get frustrated easily, makeup ideas sometimes, 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  Her Aricept was stopped by her primary care doctor due to weight loss and decreased appetite.  She was started on Seroquel 25 mg at night for palpitations in May 2019.  MMSE was 18/30 in May 2019.   She presents today for follow-up accompanied by her-2 daughters.  They report there has been no change in her memory.  She is tolerating the Seroquel at bedtime well and her daughter reports that she noticed an immediate improvement in her hallucinations and agitation.  [place] She requires assistance with all of her ADLs and her family prepares her meals.  She does have a good appetite.  She has not had any falls, however she is never left alone.  Her daughter reports they are having a hard time getting her to bathe regularly.  She also reports that she has become obsessed with finding a boyfriend.  Her daughter also reports that the patient has been showing some sexual behavior.  She has also been approaching men when out in public. The daughter, [name] Goodman her primary caregiver.   Update June 25, 2019 SS: Memory score today is 14/30. She has had some decline in memory. She lives with her daughter. She requires assistance with ADLs, dressing and bathing. She has a good appetite, does not cook. They do not have  any home health or aid in the home. She isn't left alone during day. No issues of wandering. She sleeps late during the day around 11 am, but reports sleeps well. Is not on Aricept because weight loss. Is on Namenda and Seroquel. The behavior has improved with seroquel (wandering, emotions). She still expresses to her family she is looking for a female  companion. Her overall health has been good. She fell in July, broke some bones in her right hand. She is PT. She has gained 10 lbs since visit.   REVIEW OF SYSTEMS: Out of a complete 14 system review of symptoms, the patient complains only of the following symptoms, and all other reviewed systems are negative.  Memory loss, weakness, tremors  ALLERGIES: Allergies  Allergen Reactions   Donepezil Other (See Comments)    Weight loss    HOME MEDICATIONS: Outpatient Medications Prior to Visit  Medication Sig Dispense Refill   acetaminophen (TYLENOL) 325 MG tablet Take 650 mg by mouth every 6 (six) hours as needed for mild pain, moderate pain or headache.     albuterol (PROVENTIL HFA;VENTOLIN HFA) 108 (90 BASE) MCG/ACT inhaler Inhale 2 puffs into the lungs every 6 (six) hours as needed for wheezing or shortness of breath.     albuterol (PROVENTIL) (2.5 MG/3ML) 0.083% nebulizer solution Take 2.5 mg by nebulization every 6 (six) hours as needed for wheezing or shortness of breath.     amLODipine (NORVASC) 5 MG tablet Take 5 mg by mouth daily.     atorvastatin (LIPITOR) 10 MG tablet Take 10 mg by mouth every evening.  6   HYDROcodone-acetaminophen (NORCO/VICODIN) 5-325 MG tablet Take 1 tablet by mouth every 6 (six) hours as needed for severe pain. 10 tablet 0   levothyroxine (SYNTHROID, LEVOTHROID) 50 MCG tablet Take 50 mcg by mouth daily before breakfast.   5   memantine (NAMENDA) 10 MG tablet TAKE 1 TABLET BY MOUTH TWICE A DAY 60 tablet 11   QUEtiapine (SEROQUEL) 50 MG tablet Take one tablet at bedtime.  Please call [phone] to make an appt for continued refills. 90 tablet 0   sertraline (ZOLOFT) 50 MG tablet Take 50 mg by mouth daily.      tamsulosin (FLOMAX) 0.4 MG CAPS capsule Take 1 capsule (0.4 mg total) by mouth daily. 30 capsule 0   vitamin B-12 500 MCG tablet Take 1 tablet (500 mcg total) by mouth daily.     Vitamin D, Ergocalciferol, (DRISDOL) 50000 units CAPS capsule  Take 50,000 Units by mouth once a week. Saturdays     ranitidine (ZANTAC) 150 MG capsule Take 150 mg by mouth every evening.     No facility-administered medications prior to visit.     PAST MEDICAL HISTORY: Past Medical History:  Diagnosis Date   Anxiety    Arthritis    Asthma    Cancer ([place]) 2010   Dr Arnoldo Morale, Stage I rectal cancer   Dementia ([place])    Depression    GERD (gastroesophageal reflux disease)    Hypertension    Hypothyroidism    Pneumonia    hx of years ago    Renal disorder     PAST SURGICAL HISTORY: Past Surgical History:  Procedure Laterality Date   CATARACT EXTRACTION Left    CATARACT EXTRACTION W/PHACO Right 05/12/2013   Procedure: CATARACT EXTRACTION PHACO AND INTRAOCULAR LENS PLACEMENT ([place]);  Surgeon: Tonny Branch, MD;  Location: [facility];  Service: Ophthalmology;  Laterality: Right;  CDE 25.23   COLON RESECTION  2010   rectal cancer   COLONOSCOPY N/A 09/16/2013   Procedure: COLONOSCOPY;  Surgeon: Danie Binder, MD;  Location: [facility];  Service: Endoscopy;  Laterality: N/A;  9:45-moved to 955   CYSTOSCOPY/URETEROSCOPY/HOLMIUM LASER/STENT PLACEMENT Right 05/13/2018   Procedure: CYSTOSCOPY/RETROGRADE/URETEROSCOPY/BLADDER BIOPSY;  Surgeon: Raynelle Bring, MD;  Location: [facility];  Service: Urology;  Laterality: Right;   ESOPHAGOGASTRODUODENOSCOPY  03/08/2009   MKL:KJZPHX esophagus, small hiatal hernia, otherwise normal   Ileocolonoscopy  03/08/2009   TAV:WPVX surgical scar present in rectal mucosa with polypoid lesion,overlying it as described above, status post a clean resection/Pan colonic diverticula, remainder of colonic mucosa appeared   KIDNEY STONE SURGERY      FAMILY HISTORY: Family History  Problem Relation Age of Onset   Dementia Mother    Dementia Father    Diabetes Sister    Colon cancer Neg Hx     SOCIAL HISTORY: Social History   Socioeconomic History   Marital status: Widowed    Spouse name: Not on file     Number of children: 4   Years of education: Not on file   Highest education level: Not on file  Occupational History   Not on file  Social Needs   Financial resource strain: Not on file   Food insecurity    Worry: Not on file    Inability: Not on file   Transportation needs    Medical: Not on file    Non-medical: Not on file  Tobacco Use   Smoking status: Never Smoker   Smokeless tobacco: Never Used  Substance and Sexual Activity   Alcohol use: No   Drug use: No   Sexual activity: Yes    Birth control/protection: Post-menopausal  Lifestyle   Physical activity    Days per week: Not on file    Minutes per session: Not on file   Stress: Not on file  Relationships   Social connections    Talks on phone: Not on file    Gets together: Not on file    Attends religious service: Not on file    Active member of club or organization: Not on file    Attends meetings of clubs or organizations: Not on file    Relationship status: Not on file   Intimate partner violence    Fear of current or ex partner: Not on file    Emotionally abused: Not on file    Physically abused: Not on file    Forced sexual activity: Not on file  Other Topics Concern   Not on file  Social History Narrative   Not on file    PHYSICAL EXAM  Vitals:   06/25/19 1327  BP: (!) 152/84  Pulse: 64  Temp: (!) 96.2 F (35.7 C)  Weight: 136 lb 12.8 oz (62.1 kg)  Height: _0  (1.575 m)   Body mass index is 25.02 kg/m.  Generalized: Well developed, in no acute distress  MMSE - Mini Mental State Exam 06/25/2019 12/09/2018 03/12/2018  Not completed: - (No Data) -  Orientation to time 1 0 2  Orientation to Place _1 Registration _2 Attention/ Calculation 0 2 3  Recall 0 0 0  Language- name 2 objects _3 Language- repeat _4 Language- follow 3 step command _5 Language- follow 3 step command-comments - She didn't place the paper in her lab or on the table -  Language-  read & follow  direction _0 Write a sentence 0 1 0  Copy design 0 0 0  Copy design-comments named 9 animals - -  Total score _1 Neurological examination  Mentation: Alert oriented to time, place, poor historian. Follows all commands speech and language fluent Cranial nerve II-XII: Pupils were equal round reactive to light. Extraocular movements were full, visual field were full on confrontational test. Facial sensation and strength were normal.  Head turning and shoulder shrug  were normal and symmetric. Motor: The motor testing reveals 5 over 5 strength of all 4 extremities. Good symmetric motor tone is noted throughout.  Sensory: Sensory testing is intact to soft touch on all 4 extremities. No evidence of extinction is noted.  Coordination: Cerebellar testing reveals good finger-nose-finger and heel-to-shin bilaterally.  Gait and station: Has to push off from seated position, slow to rise, gait is slow, intentional, no assistive device Reflexes: Deep tendon reflexes are symmetric and normal bilaterally.   DIAGNOSTIC DATA (LABS, IMAGING, TESTING) - I reviewed patient records, labs, notes, testing and imaging myself where available.  Lab Results  Component Value Date   WBC 4.9 05/13/2018   HGB 12.2 05/13/2018   HCT 37.8 05/13/2018   MCV 90.6 05/13/2018   PLT 227 05/13/2018      Component Value Date/Time   NA 140 05/13/2018 1205   K 4.3 05/13/2018 1205   CL 107 05/13/2018 1205   CO2 23 05/13/2018 1205   GLUCOSE 93 05/13/2018 1205   BUN 16 05/13/2018 1205   CREATININE 0.89 05/13/2018 1205   CALCIUM 9.8 05/13/2018 1205   PROT 7.7 03/07/2018 1713   ALBUMIN 4.1 03/07/2018 1713   AST 29 03/07/2018 1713   ALT 14 03/07/2018 1713   ALKPHOS 87 03/07/2018 1713   BILITOT 1.4 (H) 03/07/2018 1713   GFRNONAA 58 (L) 05/13/2018 1205   GFRAA >60 05/13/2018 1205   No results found for: CHOL, HDL, LDLCALC, LDLDIRECT, TRIG, CHOLHDL No results found for: HGBA1C Lab Results    Component Value Date   VITAMINB12 150 (L) 11/02/2017   Lab Results  Component Value Date   TSH 3.559 11/02/2017    ASSESSMENT AND PLAN 85 y.o. year old female  has a past medical history of Anxiety, Arthritis, Asthma, Cancer ([place]) (2010), Dementia ([place]), Depression, GERD (gastroesophageal reflux disease), Hypertension, Hypothyroidism, Pneumonia, and Renal disorder. here with:  1.  Dementia with behavioral disturbances  -MMSE stable 14/30 -Continue Seroquel 50 mg at bedtime, is helpful for wandering, agitation, daughter does not want to try dose reduction at this time -Continue Namenda 10 mg twice a day, has been off Aricept due to weight loss in past -10 lb weight gain since last visit -Follow-up in 6 months, they will discuss with her primary to see if they are comfortable managing her memory in the future, if so, can follow-up here as needed  I spent 15 minutes with the patient. 50% of this time was spent discussing her plan of care.    Butler Denmark, AGNP-C, DNP 06/25/2019, 1:29 PM [facility] [address] [place], [place] [address] [phone]

## 2019-06-25 NOTE — Patient Instructions (Signed)
Continue Namenda and Seroquel. Memory score is stable today 14/30. Discuss with primary care about ongoing management of memory loss.

## 2019-06-30 NOTE — Progress Notes (Signed)
I have reviewed and agreed above plan. 

## 2019-10-25 ENCOUNTER — Emergency Department (HOSPITAL_COMMUNITY): Payer: Medicare HMO

## 2019-10-25 ENCOUNTER — Emergency Department (HOSPITAL_COMMUNITY)
Admission: EM | Admit: 2019-10-25 | Discharge: 2019-10-25 | Disposition: A | Discharge status: Home / Self Care | Payer: Medicare HMO | Attending: Emergency Medicine | Admitting: Emergency Medicine

## 2019-10-25 ENCOUNTER — Other Ambulatory Visit: Payer: Self-pay

## 2019-10-25 ENCOUNTER — Encounter (HOSPITAL_COMMUNITY): Payer: Self-pay | Admitting: Emergency Medicine

## 2019-10-25 DIAGNOSIS — Z79899 Other long term (current) drug therapy: Secondary | ICD-10-CM | POA: Diagnosis not present

## 2019-10-25 DIAGNOSIS — E039 Hypothyroidism, unspecified: Secondary | ICD-10-CM | POA: Insufficient documentation

## 2019-10-25 DIAGNOSIS — F0390 Unspecified dementia without behavioral disturbance: Secondary | ICD-10-CM | POA: Insufficient documentation

## 2019-10-25 DIAGNOSIS — R0602 Shortness of breath: Secondary | ICD-10-CM | POA: Diagnosis present

## 2019-10-25 DIAGNOSIS — U071 COVID-19: Secondary | ICD-10-CM | POA: Diagnosis not present

## 2019-10-25 DIAGNOSIS — I10 Essential (primary) hypertension: Secondary | ICD-10-CM | POA: Insufficient documentation

## 2019-10-25 DIAGNOSIS — J45909 Unspecified asthma, uncomplicated: Secondary | ICD-10-CM | POA: Diagnosis not present

## 2019-10-25 MED ORDER — ONDANSETRON 4 MG PO TBDP
4.0000 mg | ORAL_TABLET | Freq: Once | ORAL | Status: AC
  Administered 2019-10-25: 23:00:00 4 mg via ORAL
  Filled 2019-10-25: qty 1

## 2019-10-25 MED ORDER — ONDANSETRON 4 MG PO TBDP: ORAL_TABLET | ORAL | 0 refills | Status: AC

## 2019-10-25 NOTE — ED Provider Notes (Signed)
[facility] EMERGENCY DEPARTMENT Provider Note   CSN: [id number] Arrival date & time: 10/25/19  2150     History Chief Complaint  Patient presents with  . Shortness of Breath    COVID+    [name] Goodman is a 86 y.o. female.  Patient was diagnosed with Covid recently.  Patient son called paramedics because she was short of breath.  Patient has no shortness of breath now.  She did have some nausea  The history is provided by the patient. No language interpreter was used.  Shortness of Breath Severity:  Mild Onset quality:  Sudden Timing:  Intermittent Progression:  Improving Chronicity:  New Context: activity   Relieved by:  Nothing Worsened by:  Nothing Associated symptoms: no abdominal pain, no chest pain, no cough, no headaches and no rash        Past Medical History:  Diagnosis Date  . Anxiety   . Arthritis   . Asthma   . Cancer [facility]) 2010   Dr Arnoldo Morale, Stage I rectal cancer  . Dementia ([place])   . Depression   . GERD (gastroesophageal reflux disease)   . Hypertension   . Hypothyroidism   . Pneumonia    hx of years ago   . Renal disorder     Patient Active Problem List   Diagnosis Date Noted  . Orthostatic hypotension 11/02/2017  . Syncope 11/01/2017  . Hypothyroidism 11/01/2017  . Depression 11/01/2017  . Dementia with behavioral disturbance ([place]) 09/10/2017  . Dementia ([place]) 05/31/2017  . GASTROINTESTINAL HEMORRHAGE, HX OF 04/28/2009  . HYPERTENSION 04/26/2009  . ARTHRITIS 04/26/2009  . Malignant neoplasm of rectum ([place]) 11/10/2008    Past Surgical History:  Procedure Laterality Date  . CATARACT EXTRACTION Left   . CATARACT EXTRACTION W/PHACO Right 05/12/2013   Procedure: CATARACT EXTRACTION PHACO AND INTRAOCULAR LENS PLACEMENT ([facility]);  Surgeon: Tonny Branch, MD;  Location: [facility];  Service: Ophthalmology;  Laterality: Right;  CDE 25.23  . COLON RESECTION  2010   rectal cancer  . COLONOSCOPY N/A 09/16/2013   Procedure: COLONOSCOPY;  Surgeon: Danie Binder, MD;  Location: [facility];  Service: Endoscopy;  Laterality: N/A;  9:45-moved to 955  . CYSTOSCOPY/URETEROSCOPY/HOLMIUM LASER/STENT PLACEMENT Right 05/13/2018   Procedure: CYSTOSCOPY/RETROGRADE/URETEROSCOPY/BLADDER BIOPSY;  Surgeon: Raynelle Bring, MD;  Location: [facility];  Service: Urology;  Laterality: Right;  . ESOPHAGOGASTRODUODENOSCOPY  03/08/2009   [id number] esophagus, small hiatal hernia, otherwise normal  . Ileocolonoscopy  03/08/2009   [id number] surgical scar present in rectal mucosa with polypoid lesion,overlying it as described above, status post a clean resection/Pan colonic diverticula, remainder of colonic mucosa appeared  . KIDNEY STONE SURGERY       OB History   No obstetric history on file.     Family History  Problem Relation Age of Onset  . Dementia Mother   . Dementia Father   . Diabetes Sister   . Colon cancer Neg Hx     Social History   Tobacco Use  . Smoking status: Never Smoker  . Smokeless tobacco: Never Used  Substance Use Topics  . Alcohol use: No  . Drug use: No    Home Medications Prior to Admission medications   Medication Sig Start Date End Date Taking? Authorizing Provider  acetaminophen (TYLENOL) 325 MG tablet Take 650 mg by mouth every 6 (six) hours as needed for mild pain, moderate pain or headache.    [provider]  albuterol (PROVENTIL HFA;VENTOLIN HFA) 108 (90 BASE) MCG/ACT inhaler Inhale  2 puffs into the lungs every 6 (six) hours as needed for wheezing or shortness of breath.    [provider]  albuterol (PROVENTIL) (2.5 MG/3ML) 0.083% nebulizer solution Take 2.5 mg by nebulization every 6 (six) hours as needed for wheezing or shortness of breath.    [provider]  amLODipine (NORVASC) 5 MG tablet Take 5 mg by mouth daily.    [provider]  atorvastatin (LIPITOR) 10 MG tablet Take 10 mg by mouth every evening. 02/24/18   [provider]  HYDROcodone-acetaminophen  (NORCO/VICODIN) 5-325 MG tablet Take 1 tablet by mouth every 6 (six) hours as needed for severe pain. 03/07/18   Mesner, Corene Cornea, MD  levothyroxine (SYNTHROID, LEVOTHROID) 50 MCG tablet Take 50 mcg by mouth daily before breakfast.  03/21/17   [provider]  memantine (NAMENDA) 10 MG tablet Take 1 tablet (10 mg total) by mouth 2 (two) times daily. 06/25/19   Suzzanne Cloud, NP  QUEtiapine (SEROQUEL) 50 MG tablet Take one tablet at bedtime. 06/25/19   Suzzanne Cloud, NP  ranitidine (ZANTAC) 150 MG capsule Take 150 mg by mouth every evening. 01/25/18 07/24/18  [provider]  sertraline (ZOLOFT) 50 MG tablet Take 50 mg by mouth daily.  05/10/16   [provider]  tamsulosin (FLOMAX) 0.4 MG CAPS capsule Take 1 capsule (0.4 mg total) by mouth daily. 03/07/18   Mesner, Corene Cornea, MD  vitamin B-12 500 MCG tablet Take 1 tablet (500 mcg total) by mouth daily. 11/03/17   Orson Eva, MD  Vitamin D, Ergocalciferol, (DRISDOL) 50000 units CAPS capsule Take 50,000 Units by mouth once a week. Saturdays    [provider]    Allergies    Donepezil  Review of Systems   Review of Systems  Constitutional: Negative for appetite change and fatigue.  HENT: Negative for congestion, ear discharge and sinus pressure.   Eyes: Negative for discharge.  Respiratory: Positive for shortness of breath. Negative for cough.   Cardiovascular: Negative for chest pain.  Gastrointestinal: Positive for nausea. Negative for abdominal pain and diarrhea.  Genitourinary: Negative for frequency and hematuria.  Musculoskeletal: Negative for back pain.  Skin: Negative for rash.  Neurological: Negative for seizures and headaches.  Psychiatric/Behavioral: Negative for hallucinations.    Physical Exam Updated Vital Signs BP (!) 127/55   Pulse 61   Resp 14   Ht 5\' 2"  (1.575 m)   Wt 61.2 kg   SpO2 99%   BMI 24.69 kg/m   Physical Exam Vitals and nursing note reviewed.  Constitutional:      Appearance: She  is well-developed.  HENT:     Head: Normocephalic.     Nose: Nose normal.  Eyes:     General: No scleral icterus.    Conjunctiva/sclera: Conjunctivae normal.  Neck:     Thyroid: No thyromegaly.  Cardiovascular:     Rate and Rhythm: Normal rate and regular rhythm.     Heart sounds: No murmur. No friction rub. No gallop.   Pulmonary:     Breath sounds: No stridor. No wheezing or rales.  Chest:     Chest wall: No tenderness.  Abdominal:     General: There is no distension.     Tenderness: There is no abdominal tenderness. There is no rebound.  Musculoskeletal:        General: Normal range of motion.     Cervical back: Neck supple.  Lymphadenopathy:     Cervical: No cervical adenopathy.  Skin:  Findings: No erythema or rash.  Neurological:     Mental Status: She is oriented to person, place, and time.     Motor: No abnormal muscle tone.     Coordination: Coordination normal.  Psychiatric:        Behavior: Behavior normal.     ED Results / Procedures / Treatments   Labs (all labs ordered are listed, but only abnormal results are displayed) Labs Reviewed - No data to display  EKG None  Radiology No results found.  Procedures Procedures (including critical care time)  Medications Ordered in ED Medications  ondansetron (ZOFRAN-ODT) disintegrating tablet 4 mg (has no administration in time range)    ED Course  I have reviewed the triage vital signs and the nursing notes.  Pertinent labs & imaging results that were available during my care of the patient were reviewed by me and considered in my medical decision making (see chart for details).    MDM Rules/Calculators/A&P                      [name] Goodman was evaluated in Emergency Department on 10/25/2019 for the symptoms described in the history of present illness. She was evaluated in the context of the global COVID-19 pandemic, which necessitated consideration that the patient might be at risk for infection  with the SARS-CoV-2 virus that causes COVID-19. Institutional protocols and algorithms that pertain to the evaluation of patients at risk for COVID-19 are in a state of rapid change based on information released by regulatory bodies including the CDC and federal and state organizations. These policies and algorithms were followed during the patient's care in the ED.  Final Clinical Impression(s) / ED Diagnoses Final diagnoses:  None   X-rays unremarkable.  Patient with positive Covid test.  Patient will be discharged home with Zofran Rx / DC Orders ED Discharge Orders    None       Milton Ferguson, MD 10/25/19 2255

## 2019-10-25 NOTE — Discharge Instructions (Signed)
Take Tylenol for any fever or aches.  Follow-up with your family doctor if any problem

## 2019-10-25 NOTE — ED Notes (Signed)
Called and updated pts nephew. Explained discharge instructions and answered all questions. Nephew is on the way to pick up patient.

## 2019-10-25 NOTE — ED Triage Notes (Signed)
Pt arrives from home c/o of [facility]. Pt tested positive for COVID on Tuesday. Son called EMS after pt c/o of SHOB. Pt states she is not SHOB at this time. Pt 98% on RA.

## 2019-12-31 ENCOUNTER — Ambulatory Visit: Payer: Medicare HMO | Admitting: Neurology

## 2020-01-06 ENCOUNTER — Other Ambulatory Visit: Payer: Self-pay | Admitting: Neurology

## 2020-02-05 ENCOUNTER — Other Ambulatory Visit: Payer: Self-pay | Admitting: Neurology

## 2020-07-04 ENCOUNTER — Other Ambulatory Visit: Payer: Self-pay | Admitting: Neurology

## 2020-07-28 ENCOUNTER — Other Ambulatory Visit: Payer: Self-pay | Admitting: Neurology

## 2021-05-14 ENCOUNTER — Other Ambulatory Visit: Payer: Self-pay

## 2021-05-14 ENCOUNTER — Emergency Department (HOSPITAL_COMMUNITY)
Admission: EM | Admit: 2021-05-14 | Discharge: 2021-05-14 | Disposition: A | Discharge status: Home / Self Care | Payer: Medicare HMO | Attending: Emergency Medicine | Admitting: Emergency Medicine

## 2021-05-14 ENCOUNTER — Encounter (HOSPITAL_COMMUNITY): Payer: Self-pay | Admitting: Emergency Medicine

## 2021-05-14 ENCOUNTER — Emergency Department (HOSPITAL_COMMUNITY): Payer: Medicare HMO

## 2021-05-14 DIAGNOSIS — Z85048 Personal history of other malignant neoplasm of rectum, rectosigmoid junction, and anus: Secondary | ICD-10-CM | POA: Insufficient documentation

## 2021-05-14 DIAGNOSIS — I10 Essential (primary) hypertension: Secondary | ICD-10-CM | POA: Diagnosis not present

## 2021-05-14 DIAGNOSIS — F0390 Unspecified dementia without behavioral disturbance: Secondary | ICD-10-CM | POA: Insufficient documentation

## 2021-05-14 DIAGNOSIS — Z79899 Other long term (current) drug therapy: Secondary | ICD-10-CM | POA: Diagnosis not present

## 2021-05-14 DIAGNOSIS — J45909 Unspecified asthma, uncomplicated: Secondary | ICD-10-CM | POA: Insufficient documentation

## 2021-05-14 DIAGNOSIS — N888 Other specified noninflammatory disorders of cervix uteri: Secondary | ICD-10-CM | POA: Diagnosis not present

## 2021-05-14 DIAGNOSIS — K5900 Constipation, unspecified: Secondary | ICD-10-CM | POA: Diagnosis not present

## 2021-05-14 DIAGNOSIS — E039 Hypothyroidism, unspecified: Secondary | ICD-10-CM | POA: Insufficient documentation

## 2021-05-14 DIAGNOSIS — R109 Unspecified abdominal pain: Secondary | ICD-10-CM | POA: Diagnosis present

## 2021-05-14 LAB — URINALYSIS, ROUTINE W REFLEX MICROSCOPIC
Bilirubin Urine: NEGATIVE
Glucose, UA: NEGATIVE mg/dL
Hgb urine dipstick: NEGATIVE
Ketones, ur: NEGATIVE mg/dL
Leukocytes,Ua: NEGATIVE
Nitrite: NEGATIVE
Protein, ur: NEGATIVE mg/dL
Specific Gravity, Urine: 1.021 (ref 1.005–1.030)
pH: 7 (ref 5.0–8.0)

## 2021-05-14 LAB — CBC WITH DIFFERENTIAL/PLATELET
Abs Immature Granulocytes: 0.06 10*3/uL (ref 0.00–0.07)
Basophils Absolute: 0 10*3/uL (ref 0.0–0.1)
Basophils Relative: 0 %
Eosinophils Absolute: 0.5 10*3/uL (ref 0.0–0.5)
Eosinophils Relative: 4 %
HCT: 33.3 % — ABNORMAL LOW (ref 36.0–46.0)
Hemoglobin: 10.7 g/dL — ABNORMAL LOW (ref 12.0–15.0)
Immature Granulocytes: 1 %
Lymphocytes Relative: 18 %
Lymphs Abs: 2.1 10*3/uL (ref 0.7–4.0)
MCH: 27.8 pg (ref 26.0–34.0)
MCHC: 32.1 g/dL (ref 30.0–36.0)
MCV: 86.5 fL (ref 80.0–100.0)
Monocytes Absolute: 0.8 10*3/uL (ref 0.1–1.0)
Monocytes Relative: 7 %
Neutro Abs: 8.1 10*3/uL — ABNORMAL HIGH (ref 1.7–7.7)
Neutrophils Relative %: 70 %
Platelets: 281 10*3/uL (ref 150–400)
RBC: 3.85 MIL/uL — ABNORMAL LOW (ref 3.87–5.11)
RDW: 13.2 % (ref 11.5–15.5)
WBC: 11.7 10*3/uL — ABNORMAL HIGH (ref 4.0–10.5)
nRBC: 0 % (ref 0.0–0.2)

## 2021-05-14 LAB — COMPREHENSIVE METABOLIC PANEL
ALT: 11 U/L (ref 0–44)
AST: 20 U/L (ref 15–41)
Albumin: 3.2 g/dL — ABNORMAL LOW (ref 3.5–5.0)
Alkaline Phosphatase: 96 U/L (ref 38–126)
Anion gap: 7 (ref 5–15)
BUN: 23 mg/dL (ref 8–23)
CO2: 30 mmol/L (ref 22–32)
Calcium: 10.6 mg/dL — ABNORMAL HIGH (ref 8.9–10.3)
Chloride: 101 mmol/L (ref 98–111)
Creatinine, Ser: 0.85 mg/dL (ref 0.44–1.00)
GFR, Estimated: >60 mL/min (ref >60)
Glucose, Bld: 120 mg/dL — ABNORMAL HIGH (ref 70–99)
Potassium: 3.4 mmol/L — ABNORMAL LOW (ref 3.5–5.1)
Sodium: 138 mmol/L (ref 135–145)
Total Bilirubin: 0.5 mg/dL (ref 0.3–1.2)
Total Protein: 7.3 g/dL (ref 6.5–8.1)

## 2021-05-14 LAB — LIPASE, BLOOD: Lipase: 32 U/L (ref 11–51)

## 2021-05-14 MED ORDER — SODIUM CHLORIDE 0.9 % IV BOLUS
1000.0000 mL | Freq: Once | INTRAVENOUS | Status: AC
  Administered 2021-05-14: 1000 mL via INTRAVENOUS

## 2021-05-14 MED ORDER — POLYETHYLENE GLYCOL 3350 17 G PO PACK: 17.0000 g | PACK | Freq: Every day | ORAL | 0 refills | Status: DC

## 2021-05-14 MED ORDER — IOHEXOL 300 MG/ML  SOLN
100.0000 mL | Freq: Once | INTRAMUSCULAR | Status: AC | PRN
  Administered 2021-05-14: 100 mL via INTRAVENOUS

## 2021-05-14 NOTE — ED Notes (Signed)
Pt provided bedside commode. Pt able to transfer to commode with minimal assistance.

## 2021-05-14 NOTE — ED Notes (Signed)
ED Provider at bedside. 

## 2021-05-14 NOTE — ED Notes (Signed)
Patient transported to CT 

## 2021-05-14 NOTE — Discharge Instructions (Addendum)
The CT scan showed that there is likely a mass on the patient's cervix.  It is causing problems with her ureter as well.  She will need to follow-up with the urologist, Dr. Jeffie Pollock in [place] for further evaluation of this.  She will also need to follow-up with Dr. Delsa Sale with gynecology/oncology for evaluation of the cervical mass.  The CT scan also showed that the patient likely has some constipation so we will give a prescription for MiraLAX to help treat this.  Please administer as directed.  Please have the patient return to the emergency department for any new or worsening symptoms in the meantime.

## 2021-05-14 NOTE — ED Notes (Signed)
Pt ambulated to the bathroom with one assist.  

## 2021-05-14 NOTE — ED Provider Notes (Signed)
[name] Provider Note   CSN: [id number] Arrival date & time: 05/14/21  1613     History Chief Complaint  Patient presents with   Abdominal Pain    [name] is a 87 y.o. female.  HPI  87 year old female with a history of anxiety, arthritis, asthma, cancer, dementia, depression, GERD, hypertension, hypothyroidism, pneumonia, renal disorder, who presents to the emergency department today with her daughter for evaluation of abdominal pain.  There is a level 5 caveat due to patient's history of dementia.  Her daughter provides most of the history and states that patient has been complaining of abdominal pain intermittently for the last week.  She is also had some decreased p.o. intake.  Her abdominal pain seems to subside after eating.  She has had no vomiting or diarrhea.  She states patient has had normal bowel movements and believes she had one earlier today.  She has had no fevers.  The patient asked at her baseline mental status at this time.  Past Medical History:  Diagnosis Date   Anxiety    Arthritis    Asthma    Cancer ([place]) 2010   Dr Arnoldo Morale, Stage I rectal cancer   Dementia [facility])    Depression    GERD (gastroesophageal reflux disease)    Hypertension    Hypothyroidism    Pneumonia    hx of years ago    Renal disorder     Patient Active Problem List   Diagnosis Date Noted   Orthostatic hypotension 11/02/2017   Syncope 11/01/2017   Hypothyroidism 11/01/2017   Depression 11/01/2017   Dementia with behavioral disturbance ([place]) 09/10/2017   Dementia ([place]) 05/31/2017   GASTROINTESTINAL HEMORRHAGE, HX OF 04/28/2009   HYPERTENSION 04/26/2009   ARTHRITIS 04/26/2009   Malignant neoplasm of rectum ([place]) 11/10/2008    Past Surgical History:  Procedure Laterality Date   CATARACT EXTRACTION Left    CATARACT EXTRACTION W/PHACO Right 05/12/2013   Procedure: CATARACT EXTRACTION PHACO AND INTRAOCULAR LENS PLACEMENT ([place]);  Surgeon: Tonny Branch,  MD;  Location: [facility] ORS;  Service: Ophthalmology;  Laterality: Right;  CDE 25.23   COLON RESECTION  2010   rectal cancer   COLONOSCOPY N/A 09/16/2013   Procedure: COLONOSCOPY;  Surgeon: Danie Binder, MD;  Location: [facility] ENDO SUITE;  Service: Endoscopy;  Laterality: N/A;  9:45-moved to 955   CYSTOSCOPY/URETEROSCOPY/HOLMIUM LASER/STENT PLACEMENT Right 05/13/2018   Procedure: CYSTOSCOPY/RETROGRADE/URETEROSCOPY/BLADDER BIOPSY;  Surgeon: Raynelle Bring, MD;  Location: [facility] ORS;  Service: Urology;  Laterality: Right;   ESOPHAGOGASTRODUODENOSCOPY  03/08/2009   [id number] esophagus, small hiatal hernia, otherwise normal   Ileocolonoscopy  03/08/2009   [id number] surgical scar present in rectal mucosa with polypoid lesion,overlying it as described above, status post a clean resection/Pan colonic diverticula, remainder of colonic mucosa appeared   KIDNEY STONE SURGERY       OB History   No obstetric history on file.     Family History  Problem Relation Age of Onset   Dementia Mother    Dementia Father    Diabetes Sister    Colon cancer Neg Hx     Social History   Tobacco Use   Smoking status: Never   Smokeless tobacco: Never  Vaping Use   Vaping Use: Never used  Substance Use Topics   Alcohol use: No   Drug use: No    Home Medications Prior to Admission medications   Medication Sig Start Date End Date Taking? Authorizing Provider  acetaminophen (TYLENOL) 325 MG  tablet Take 650 mg by mouth every 6 (six) hours as needed for mild pain, moderate pain or headache.   Yes [provider]  albuterol (PROVENTIL HFA;VENTOLIN HFA) 108 (90 BASE) MCG/ACT inhaler Inhale 2 puffs into the lungs every 6 (six) hours as needed for wheezing or shortness of breath.   Yes [provider]  albuterol (PROVENTIL) (2.5 MG/3ML) 0.083% nebulizer solution Take 2.5 mg by nebulization every 6 (six) hours as needed for wheezing or shortness of breath.   Yes [provider]  amLODipine (NORVASC)  10 MG tablet Take 10 mg by mouth daily. 04/04/21  Yes [provider]  atorvastatin (LIPITOR) 10 MG tablet Take 10 mg by mouth every evening. 02/24/18  Yes [provider]  levothyroxine (SYNTHROID, LEVOTHROID) 50 MCG tablet Take 50 mcg by mouth daily before breakfast.  03/21/17  Yes [provider]  memantine (NAMENDA) 10 MG tablet Take 1 tablet (10 mg total) by mouth 2 (two) times daily. Future refills from PCP 07/05/20  Yes Suzzanne Cloud, NP  polyethylene glycol (MIRALAX) 17 g packet Take 17 g by mouth daily. Dissolve one cap full in solution (water, gatorade, etc.) and administer once cap-full daily. You may titrate up daily by 1 cap-full until the patient is having pudding consistency of stools. After the patient is able to start passing softer stools they will need to be on 1/2 cap-full daily for 2 weeks. 05/14/21  Yes Emmalou Hunger S, PA-C  QUEtiapine (SEROQUEL) 50 MG tablet Take one tablet at bedtime. Patient taking differently: Take 50 mg by mouth at bedtime. Take one tablet at bedtime. 06/25/19  Yes Suzzanne Cloud, NP  sertraline (ZOLOFT) 50 MG tablet Take 50 mg by mouth daily.  05/10/16  Yes [provider]  vitamin B-12 500 MCG tablet Take 1 tablet (500 mcg total) by mouth daily. 11/03/17  Yes Tat, Shanon Brow, MD  Vitamin D, Ergocalciferol, (DRISDOL) 50000 units CAPS capsule Take 50,000 Units by mouth once a week. Saturdays   Yes [provider]  ondansetron (ZOFRAN ODT) 4 MG disintegrating tablet '4mg'$  ODT q4 hours prn nausea/vomit Patient not taking: Reported on 05/14/2021 10/25/19   Milton Ferguson, MD  tamsulosin (FLOMAX) 0.4 MG CAPS capsule Take 1 capsule (0.4 mg total) by mouth daily. Patient not taking: Reported on 05/14/2021 03/07/18   Mesner, Corene Cornea, MD    Allergies    Donepezil  Review of Systems   Review of Systems  Unable to perform ROS: Dementia   Physical Exam Updated Vital Signs BP 107/68   Pulse 74   Temp 98.4 F (36.9 C) (Oral)   Resp  20   Ht '5\' 2"'$  (1.575 m)   Wt 68 kg   SpO2 99%   BMI 27.44 kg/m   Physical Exam Vitals and nursing note reviewed.  Constitutional:      General: She is not in acute distress.    Appearance: She is well-developed.  HENT:     Head: Normocephalic and atraumatic.     Mouth/Throat:     Mouth: Mucous membranes are dry.  Eyes:     Conjunctiva/sclera: Conjunctivae normal.  Cardiovascular:     Rate and Rhythm: Normal rate and regular rhythm.     Heart sounds: Normal heart sounds. No murmur heard. Pulmonary:     Effort: Pulmonary effort is normal. No respiratory distress.     Breath sounds: Normal breath sounds. No wheezing, rhonchi or rales.  Abdominal:     Palpations: Abdomen is soft.  Tenderness: There is no abdominal tenderness. There is no guarding or rebound.  Musculoskeletal:     Cervical back: Neck supple.  Skin:    General: Skin is warm and dry.  Neurological:     Mental Status: She is alert.    ED Results / Procedures / Treatments   Labs (all labs ordered are listed, but only abnormal results are displayed) Labs Reviewed  CBC WITH DIFFERENTIAL/PLATELET - Abnormal; Notable for the following components:      Result Value   WBC 11.7 (*)    RBC 3.85 (*)    Hemoglobin 10.7 (*)    HCT 33.3 (*)    Neutro Abs 8.1 (*)    All other components within normal limits  COMPREHENSIVE METABOLIC PANEL - Abnormal; Notable for the following components:   Potassium 3.4 (*)    Glucose, Bld 120 (*)    Calcium 10.6 (*)    Albumin 3.2 (*)    All other components within normal limits  URINALYSIS, ROUTINE W REFLEX MICROSCOPIC - Abnormal; Notable for the following components:   Color, Urine STRAW (*)    All other components within normal limits  LIPASE, BLOOD    EKG None  Radiology CT ABDOMEN PELVIS W CONTRAST  Result Date: 05/14/2021 CLINICAL DATA:  Lower abdominal pain for 1 week. Personal history of rectal carcinoma. EXAM: CT ABDOMEN AND PELVIS WITH CONTRAST TECHNIQUE:  Multidetector CT imaging of the abdomen and pelvis was performed using the standard protocol following bolus administration of intravenous contrast. CONTRAST:  151m OMNIPAQUE IOHEXOL 300 MG/ML  SOLN COMPARISON:  03/07/2018 FINDINGS: Lower Chest: No acute findings. Hepatobiliary: No hepatic masses identified. Multiple tiny less than 1 cm gallstones are seen, however there is no evidence of cholecystitis or biliary ductal dilatation. Pancreas:  No mass or inflammatory changes. Spleen: Within normal limits in size and appearance. Adrenals/Urinary Tract: No masses identified. Right renal cysts again noted, largest measuring 5 cm. Moderate to severe right hydroureteronephrosis is similar to previous study, however previously seen distal right ureteral calculus is no longer visualized. The distal right ureter is engulfed by a large pelvic mass, as described below. Unremarkable unopacified urinary bladder. Stomach/Bowel: No evidence of obstruction, inflammatory process or abnormal fluid collections. Large stool burden noted throughout the colon. Vascular/Lymphatic: No pathologically enlarged lymph nodes. No acute vascular findings. Aortic atherosclerotic calcification noted. Reproductive: A new large heterogeneous soft tissue mass is seen which is centered in the uterine cervix, and shows evidence of extension into the right parametrium. This measures 7.0 x 5.7 by 7.4 cm, highly suspicious for cervical carcinoma. Other:  None. Musculoskeletal:  No suspicious bone lesions identified. IMPRESSION: New soft tissue mass centered in the uterine cervix, with extension into the right parametrium, highly suspicious for cervical carcinoma. Moderate to severe right hydroureteronephrosis, with distal right ureter involved by the cervical mass. No evidence of abdominal metastatic disease. Cholelithiasis. No radiographic evidence of cholecystitis. Large colonic stool burden noted. Aortic Atherosclerosis (ICD10-I70.0). Electronically  Signed   By: JMarlaine HindM.D.   On: 05/14/2021 19:34    Procedures Procedures   Medications Ordered in ED Medications  sodium chloride 0.9 % bolus 1,000 mL (0 mLs Intravenous Stopped 05/14/21 1821)  iohexol (OMNIPAQUE) 300 MG/ML solution 100 mL (100 mLs Intravenous Contrast Given 05/14/21 1825)    ED Course  I have reviewed the triage vital signs and the nursing notes.  Pertinent labs & imaging results that were available during my care of the patient were reviewed by me and  considered in my medical decision making (see chart for details).    MDM Rules/Calculators/A&P                           87 year old female presenting for evaluation of abdominal pain and decreased p.o. intake  Reviewed/interpreted labs CBC with mild leukocytosis CMP with mild hypokalemia, otherwise reassuring Lipase negative UA negative  Reviewed/interpreted imaging, agree with radiologist CT abd/pelvis - New soft tissue mass centered in the uterine cervix, with extension into the right parametrium, highly suspicious for cervical carcinoma. Moderate to severe right hydroureteronephrosis, with distal right ureter involved by the cervical mass. No evidence of abdominal metastatic disease. Cholelithiasis. No radiographic evidence of cholecystitis. Large colonic stool burden noted. Aortic Atherosclerosis  8:34 PM CONSULT with Dr. Jeffie Pollock with urology.  He recommends that the patient follow-up in the office next week.  The office will reach out to the patient's daughter to set up an appointment.  8:50 PM CONSULT with Dr. Delsa Sale with gynecology oncology who recommends that the patient call the office on Monday to schedule an appointment for follow-up  Reassessed patient.  I discussed with the daughter at bedside the findings on the CT scan that are concerning for malignancy.  Discussed the plan for follow-up with urology and oncology.  I further discussed that patient appears to have some constipation so we  will start her on MiraLAX.  Have advised on strict return precautions for any new or worsening symptoms.  She voices understanding the plan and reasons to return.  All questions answered.  Patient stable for discharge.  Case discussed with supervising physician, Dr. Karle Starch who is in agreement with the plan  Final Clinical Impression(s) / ED Diagnoses Final diagnoses:  Cervical mass  Constipation, unspecified constipation type    Rx / DC Orders ED Discharge Orders          Ordered    polyethylene glycol (MIRALAX) 17 g packet  Daily        05/14/21 2237             Rodney Booze, PA-C 05/14/21 2237    Milton Ferguson, MD 05/16/21 [phone]

## 2021-05-14 NOTE — ED Triage Notes (Signed)
Pt brought in by family for concerns of lower abd pain for the last week. Pt also has not been eating as good as she should. Hx of dementia.

## 2021-05-14 NOTE — ED Notes (Signed)
Pt d/c home per MD order . Discharge summary reviewed with pt and pt daughter, verbalize understanding. Off unit via WC. Discharged home with daughter. No s/s of acute distress noted at discharge.

## 2021-05-17 ENCOUNTER — Emergency Department (HOSPITAL_COMMUNITY): Payer: Medicare HMO

## 2021-05-17 ENCOUNTER — Emergency Department (HOSPITAL_COMMUNITY)
Admission: EM | Admit: 2021-05-17 | Discharge: 2021-05-17 | Disposition: A | Discharge status: Home / Self Care | Payer: Medicare HMO | Attending: Emergency Medicine | Admitting: Emergency Medicine

## 2021-05-17 ENCOUNTER — Encounter (HOSPITAL_COMMUNITY): Payer: Self-pay | Admitting: *Deleted

## 2021-05-17 DIAGNOSIS — K644 Residual hemorrhoidal skin tags: Secondary | ICD-10-CM | POA: Insufficient documentation

## 2021-05-17 DIAGNOSIS — E039 Hypothyroidism, unspecified: Secondary | ICD-10-CM | POA: Diagnosis not present

## 2021-05-17 DIAGNOSIS — Z85048 Personal history of other malignant neoplasm of rectum, rectosigmoid junction, and anus: Secondary | ICD-10-CM | POA: Diagnosis not present

## 2021-05-17 DIAGNOSIS — F0391 Unspecified dementia with behavioral disturbance: Secondary | ICD-10-CM | POA: Insufficient documentation

## 2021-05-17 DIAGNOSIS — K5900 Constipation, unspecified: Secondary | ICD-10-CM | POA: Diagnosis present

## 2021-05-17 DIAGNOSIS — J45909 Unspecified asthma, uncomplicated: Secondary | ICD-10-CM | POA: Diagnosis not present

## 2021-05-17 DIAGNOSIS — Z79899 Other long term (current) drug therapy: Secondary | ICD-10-CM | POA: Insufficient documentation

## 2021-05-17 DIAGNOSIS — I10 Essential (primary) hypertension: Secondary | ICD-10-CM | POA: Diagnosis not present

## 2021-05-17 LAB — CBC WITH DIFFERENTIAL/PLATELET
Abs Immature Granulocytes: 0.1 10*3/uL — ABNORMAL HIGH (ref 0.00–0.07)
Basophils Absolute: 0 10*3/uL (ref 0.0–0.1)
Basophils Relative: 0 %
Eosinophils Absolute: 0.2 10*3/uL (ref 0.0–0.5)
Eosinophils Relative: 1 %
HCT: 33.3 % — ABNORMAL LOW (ref 36.0–46.0)
Hemoglobin: 10.7 g/dL — ABNORMAL LOW (ref 12.0–15.0)
Immature Granulocytes: 1 %
Lymphocytes Relative: 8 %
Lymphs Abs: 1.2 10*3/uL (ref 0.7–4.0)
MCH: 27.4 pg (ref 26.0–34.0)
MCHC: 32.1 g/dL (ref 30.0–36.0)
MCV: 85.4 fL (ref 80.0–100.0)
Monocytes Absolute: 0.8 10*3/uL (ref 0.1–1.0)
Monocytes Relative: 5 %
Neutro Abs: 12.5 10*3/uL — ABNORMAL HIGH (ref 1.7–7.7)
Neutrophils Relative %: 85 %
Platelets: 331 10*3/uL (ref 150–400)
RBC: 3.9 MIL/uL (ref 3.87–5.11)
RDW: 13.4 % (ref 11.5–15.5)
WBC: 14.8 10*3/uL — ABNORMAL HIGH (ref 4.0–10.5)
nRBC: 0 % (ref 0.0–0.2)

## 2021-05-17 LAB — BASIC METABOLIC PANEL
Anion gap: 9 (ref 5–15)
BUN: 16 mg/dL (ref 8–23)
CO2: 26 mmol/L (ref 22–32)
Calcium: 10 mg/dL (ref 8.9–10.3)
Chloride: 99 mmol/L (ref 98–111)
Creatinine, Ser: 0.82 mg/dL (ref 0.44–1.00)
GFR, Estimated: >60 mL/min (ref >60)
Glucose, Bld: 148 mg/dL — ABNORMAL HIGH (ref 70–99)
Potassium: 3.6 mmol/L (ref 3.5–5.1)
Sodium: 134 mmol/L — ABNORMAL LOW (ref 135–145)

## 2021-05-17 MED ORDER — POLYETHYLENE GLYCOL 3350 17 GM/SCOOP PO POWD: 1.0000 | Freq: Once | ORAL | 0 refills | Status: AC

## 2021-05-17 MED ORDER — HYDROCORTISONE (PERIANAL) 2.5 % EX CREA: 1.0000 "application " | TOPICAL_CREAM | Freq: Two times a day (BID) | CUTANEOUS | 0 refills | Status: AC

## 2021-05-17 MED ORDER — SORBITOL 70 % SOLN
960.0000 mL | TOPICAL_OIL | Freq: Once | ORAL | Status: DC
  Filled 2021-05-17: qty 473

## 2021-05-17 NOTE — ED Notes (Signed)
Went with PA to remove stool from pt bottom.

## 2021-05-17 NOTE — ED Triage Notes (Signed)
Constipation for several days, seen in the ED on 05/14/21

## 2021-05-17 NOTE — ED Provider Notes (Signed)
[facility] EMERGENCY DEPARTMENT Provider Note   CSN: [id number] Arrival date & time: 05/17/21  1336     History Chief Complaint  Patient presents with   Constipation    [name] Goodman is a 87 y.o. female here for evaluation of constipation. Began a few days ago. Seen here in ED on 05/14/21 was noted to have new cervical mass concerning for malignancy with subsequent hydroureter. Also noted to have constipation on CT imaging., Started on Miralax.  Taking 1 scoop daily.  Had a couple sips of mag citrate.  Because some seepage.  Daughter states she tried to disimpact patient earlier today however feels like there is a "ball" that she is not able to get out.  She did notice a string of blood on the outside of her rectum.  Patient has only told daughter that she had some pain in her rectum when she was trying to have a bowel movement.  Otherwise she has no fever, chills, nausea, vomiting, abdominal pain, dysuria.  She is at her baseline mentation.  No recent falls or injuries. Denies additional aggrieving or alleviating fcators.  History obtained from patient and past medical records. No interpretor was used.  HPI     Past Medical History:  Diagnosis Date   Anxiety    Arthritis    Asthma    Cancer ([place]) 2010   Dr Arnoldo Morale, Stage I rectal cancer   Dementia [facility])    Depression    GERD (gastroesophageal reflux disease)    Hypertension    Hypothyroidism    Pneumonia    hx of years ago    Renal disorder     Patient Active Problem List   Diagnosis Date Noted   Orthostatic hypotension 11/02/2017   Syncope 11/01/2017   Hypothyroidism 11/01/2017   Depression 11/01/2017   Dementia with behavioral disturbance ([place]) 09/10/2017   Dementia ([place]) 05/31/2017   GASTROINTESTINAL HEMORRHAGE, HX OF 04/28/2009   HYPERTENSION 04/26/2009   ARTHRITIS 04/26/2009   Malignant neoplasm of rectum ([place]) 11/10/2008    Past Surgical History:  Procedure Laterality Date   CATARACT EXTRACTION Left     CATARACT EXTRACTION W/PHACO Right 05/12/2013   Procedure: CATARACT EXTRACTION PHACO AND INTRAOCULAR LENS PLACEMENT ([place]);  Surgeon: Tonny Branch, MD;  Location: [facility] ORS;  Service: Ophthalmology;  Laterality: Right;  CDE 25.23   COLON RESECTION  2010   rectal cancer   COLONOSCOPY N/A 09/16/2013   Procedure: COLONOSCOPY;  Surgeon: Danie Binder, MD;  Location: [facility] ENDO SUITE;  Service: Endoscopy;  Laterality: N/A;  9:45-moved to 955   CYSTOSCOPY/URETEROSCOPY/HOLMIUM LASER/STENT PLACEMENT Right 05/13/2018   Procedure: CYSTOSCOPY/RETROGRADE/URETEROSCOPY/BLADDER BIOPSY;  Surgeon: Raynelle Bring, MD;  Location: [facility] ORS;  Service: Urology;  Laterality: Right;   ESOPHAGOGASTRODUODENOSCOPY  03/08/2009   [id number] esophagus, small hiatal hernia, otherwise normal   Ileocolonoscopy  03/08/2009   [id number] surgical scar present in rectal mucosa with polypoid lesion,overlying it as described above, status post a clean resection/Pan colonic diverticula, remainder of colonic mucosa appeared   KIDNEY STONE SURGERY       OB History   No obstetric history on file.     Family History  Problem Relation Age of Onset   Dementia Mother    Dementia Father    Diabetes Sister    Colon cancer Neg Hx     Social History   Tobacco Use   Smoking status: Never   Smokeless tobacco: Never  Vaping Use   Vaping Use: Never used  Substance Use Topics  Alcohol use: No   Drug use: No    Home Medications Prior to Admission medications   Medication Sig Start Date End Date Taking? Authorizing Provider  hydrocortisone (ANUSOL-HC) 2.5 % rectal cream Place 1 application rectally 2 (two) times daily. 05/17/21  Yes Mechille Varghese A, PA-C  polyethylene glycol powder (GLYCOLAX/MIRALAX) 17 GM/SCOOP powder Take 255 g by mouth once for 1 dose. 05/17/21 05/17/21 Yes Fallon Haecker A, PA-C  acetaminophen (TYLENOL) 325 MG tablet Take 650 mg by mouth every 6 (six) hours as needed for mild pain, moderate pain or headache.    [provider]  albuterol (PROVENTIL HFA;VENTOLIN HFA) 108 (90 BASE) MCG/ACT inhaler Inhale 2 puffs into the lungs every 6 (six) hours as needed for wheezing or shortness of breath.    [provider]  albuterol (PROVENTIL) (2.5 MG/3ML) 0.083% nebulizer solution Take 2.5 mg by nebulization every 6 (six) hours as needed for wheezing or shortness of breath.    [provider]  amLODipine (NORVASC) 10 MG tablet Take 10 mg by mouth daily. 04/04/21   [provider]  atorvastatin (LIPITOR) 10 MG tablet Take 10 mg by mouth every evening. 02/24/18   [provider]  levothyroxine (SYNTHROID, LEVOTHROID) 50 MCG tablet Take 50 mcg by mouth daily before breakfast.  03/21/17   [provider]  memantine (NAMENDA) 10 MG tablet Take 1 tablet (10 mg total) by mouth 2 (two) times daily. Future refills from PCP 07/05/20   Suzzanne Cloud, NP  ondansetron (ZOFRAN ODT) 4 MG disintegrating tablet '4mg'$  ODT q4 hours prn nausea/vomit Patient not taking: Reported on 05/14/2021 10/25/19   Milton Ferguson, MD  QUEtiapine (SEROQUEL) 50 MG tablet Take one tablet at bedtime. Patient taking differently: Take 50 mg by mouth at bedtime. Take one tablet at bedtime. 06/25/19   Suzzanne Cloud, NP  sertraline (ZOLOFT) 50 MG tablet Take 50 mg by mouth daily.  05/10/16   [provider]  tamsulosin (FLOMAX) 0.4 MG CAPS capsule Take 1 capsule (0.4 mg total) by mouth daily. Patient not taking: Reported on 05/14/2021 03/07/18   Mesner, Corene Cornea, MD  vitamin B-12 500 MCG tablet Take 1 tablet (500 mcg total) by mouth daily. 11/03/17   Orson Eva, MD  Vitamin D, Ergocalciferol, (DRISDOL) 50000 units CAPS capsule Take 50,000 Units by mouth once a week. Saturdays    [provider]    Allergies    Donepezil  Review of Systems   Review of Systems  Constitutional: Negative.   HENT: Negative.    Respiratory: Negative.    Cardiovascular: Negative.   Gastrointestinal:  Positive for constipation  and rectal pain. Negative for abdominal distention, abdominal pain, blood in stool, diarrhea, nausea and vomiting.  Genitourinary: Negative.   Musculoskeletal: Negative.   Skin: Negative.   Neurological: Negative.   All other systems reviewed and are negative.  Physical Exam Updated Vital Signs BP 127/75   Pulse 82   Temp 98.8 F (37.1 C) (Oral)   Resp 17   SpO2 98%   Physical Exam Vitals and nursing note reviewed.  Constitutional:      General: She is not in acute distress.    Appearance: She is well-developed. She is not ill-appearing, toxic-appearing or diaphoretic.  HENT:     Head: Normocephalic and atraumatic.     Nose: Nose normal.     Mouth/Throat:     Mouth: Mucous membranes are moist.  Eyes:     Pupils: Pupils are equal, round, and reactive  to light.  Cardiovascular:     Rate and Rhythm: Normal rate.     Pulses: Normal pulses.     Heart sounds: Normal heart sounds.  Pulmonary:     Effort: Pulmonary effort is normal. No respiratory distress.     Breath sounds: Normal breath sounds.  Abdominal:     General: Bowel sounds are normal. There is no distension.     Palpations: Abdomen is soft.     Tenderness: There is no abdominal tenderness. There is no guarding or rebound.     Hernia: No hernia is present.  Genitourinary:    Comments: Very large stool ball to rectum.  Tech in room.  Significant stool removal, no gross blood. Small external hemorrhoid  Musculoskeletal:        General: Normal range of motion.     Cervical back: Normal range of motion.  Skin:    General: Skin is warm and dry.     Capillary Refill: Capillary refill takes less than 2 seconds.  Neurological:     General: No focal deficit present.     Mental Status: She is alert. Mental status is at baseline.     Comments: At baseline per family CN 2-12 grossly intact Ambulatory without deficits Equal grip  Psychiatric:        Mood and Affect: Mood normal.    ED Results / Procedures / Treatments    Labs (all labs ordered are listed, but only abnormal results are displayed) Labs Reviewed  CBC WITH DIFFERENTIAL/PLATELET - Abnormal; Notable for the following components:      Result Value   WBC 14.8 (*)    Hemoglobin 10.7 (*)    HCT 33.3 (*)    Neutro Abs 12.5 (*)    Abs Immature Granulocytes 0.10 (*)    All other components within normal limits  BASIC METABOLIC PANEL - Abnormal; Notable for the following components:   Sodium 134 (*)    Glucose, Bld 148 (*)    All other components within normal limits    EKG None  Radiology DG Abdomen Acute W/Chest  Result Date: 05/17/2021 CLINICAL DATA:  Abdominal pain.  Constipation. EXAM: DG ABDOMEN ACUTE WITH 1 VIEW CHEST COMPARISON:  CT 05/14/2021.  Chest x-ray 04/03/2020. FINDINGS: Mediastinum and hilar structures normal. Heart size normal. No focal infiltrate. No pleural effusion or pneumothorax. Degenerative changes scoliosis thoracic spine. Soft tissue structures of the abdomen are unremarkable. Large amount of stool noted throughout the colon suggesting constipation. No bowel distention or free air. Vascular calcifications again noted in the abdomen and pelvis. Diffuse osteopenia. Degenerative change thoracolumbar spine and both hips. IMPRESSION: 1.  No acute cardiopulmonary disease. 2. Large amount of stool noted throughout the colon again noted. Findings suggest constipation. No acute intra-abdominal abnormality identified. Electronically Signed   By: Marcello Moores  Register   On: 05/17/2021 14:48    Procedures Fecal disimpaction  Date/Time: 05/17/2021 3:47 PM Performed by: Nettie Elm, PA-C Authorized by: Nettie Elm, PA-C  Consent: Verbal consent obtained. Written consent not obtained. Risks and benefits: risks, benefits and alternatives were discussed Consent given by: patient and guardian Patient understanding: patient states understanding of the procedure being performed Patient consent: the patient's understanding of the  procedure matches consent given Procedure consent: procedure consent matches procedure scheduled Relevant documents: relevant documents present and verified Test results: test results available and properly labeled Site marked: the operative site was marked Imaging studies: imaging studies available Patient identity confirmed: arm band Time out: Immediately  prior to procedure a "time out" was called to verify the correct patient, procedure, equipment, support staff and site/side marked as required. Preparation: Patient was prepped and draped in the usual sterile fashion. Local anesthesia used: no  Anesthesia: Local anesthesia used: no  Sedation: Patient sedated: no  Patient tolerance: patient tolerated the procedure well with no immediate complications     Medications Ordered in ED Medications  sorbitol, milk of mag, mineral oil, glycerin (SMOG) enema (has no administration in time range)    ED Course  I have reviewed the triage vital signs and the nursing notes.  Pertinent labs & imaging results that were available during my care of the patient were reviewed by me and considered in my medical decision making (see chart for details).  Her for evaluation of constipation. Seen here 2 days ago. CT showed new cervical malignancy, hydronephrosis as well as large stool burden.  Has been doing 1 capful of MiraLAX and some mag citrate at home.  Has had some mild seepage.  Per family tried manual disimpaction at home with a felt a "ball" however unable to have success.  Patient denies any complaints on exam.  Per family patient had complained of some rectal pain when trying have a bowel movement earlier today. Will plan on labs, dg abd, attempt at manual disimpaction and reassess.  Labs and imaging personally reviewed and interpreted:  CBC leks at 14.8, hgb 10.7 BMP sodium 134, glucose 148 DG abd large stool burden  I was able to manually disimpact patient.  Naida Sleight large amount of  nonbloody stool removed.  Patient with significant improvement.  She does have a small external hemorrhoid.  DC home with bowel regimen, Anusol cream for hemorrhoid.  Encouraged follow-up with urology as well as GYN Onc as previously discussed given prior CT findings.  On repeat exam patient does not have a surgical abdomin and there are no peritoneal signs.  No indication of appendicitis, bowel obstruction, bowel perforation, cholecystitis, diverticulitis.    The patient has been appropriately medically screened and/or stabilized in the ED. I have low suspicion for any other emergent medical condition which would require further screening, evaluation or treatment in the ED or require inpatient management.  Patient is hemodynamically stable and in no acute distress.  Patient able to ambulate in department prior to ED.  Evaluation does not show acute pathology that would require ongoing or additional emergent interventions while in the emergency department or further inpatient treatment.  I have discussed the diagnosis with the patient and answered all questions.  Pain is been managed while in the emergency department and patient has no further complaints prior to discharge.  Patient is comfortable with plan discussed in room and is stable for discharge at this time.  I have discussed strict return precautions for returning to the emergency department.  Patient was encouraged to follow-up with PCP/specialist refer to at discharge.     MDM Rules/Calculators/A&P                            Final Clinical Impression(s) / ED Diagnoses Final diagnoses:  Constipation, unspecified constipation type    Rx / DC Orders ED Discharge Orders          Ordered    hydrocortisone (ANUSOL-HC) 2.5 % rectal cream  2 times daily        05/17/21 1543    polyethylene glycol powder (GLYCOLAX/MIRALAX) 17 GM/SCOOP powder   Once  05/17/21 1543             Jamin Panther A, PA-C 05/17/21 1549     Isla Pence, MD 05/18/21 [phone]

## 2021-05-17 NOTE — Discharge Instructions (Addendum)
Take #2 5 mg Dulcolax Then take 8.3 ox container of miralax. Place into 72 z. liquid of choice. Drink small sips until bowel movement  Use the cream for the irritation to the rectum  Follow up with the Urologist and the Gynecologist for the cervical mass  Return for new or worsening symptoms

## 2021-05-18 ENCOUNTER — Telehealth: Payer: Self-pay

## 2021-05-18 NOTE — Telephone Encounter (Signed)
Primary number called- out of service.  Called daughter number listed- no answer no way to leave message

## 2021-05-18 NOTE — Telephone Encounter (Signed)
-----   Message from Irine Seal, MD sent at 05/14/2021  8:56 PM EDT ----- This patient was seen in the ER over the weekend and has right hydro that is probably from cervical cancer.  She didn't need urgent intervention but does need to be seen in the next couple of weeks by one of [place].   She had a stone removed by Dr. Alinda Money in 2019 and is she would like to return to see him in [place], that's fine, just forward the request to them, but at 87 she will probably want to stay local.

## 2021-05-23 NOTE — Telephone Encounter (Signed)
Attempted to call numbers listed. Still out of service.  Appt created and mailed with letter.

## 2021-06-03 ENCOUNTER — Telehealth: Payer: Self-pay

## 2021-06-03 NOTE — Telephone Encounter (Signed)
(  3:30 pm) SW scheduled initial palliative RN/SW visit with patient's daughter-Tameko for 06/09/21 @ 11:30 am.

## 2021-06-08 ENCOUNTER — Telehealth: Payer: Self-pay

## 2021-06-08 NOTE — Telephone Encounter (Signed)
(   4:50 pm) SW returning to call daughter to confirm that visit is cancelled for tommorow due to having a medical procedure.

## 2021-06-09 ENCOUNTER — Other Ambulatory Visit: Payer: Medicare HMO | Admitting: *Deleted

## 2021-06-09 ENCOUNTER — Other Ambulatory Visit: Payer: Self-pay

## 2021-06-14 ENCOUNTER — Ambulatory Visit: Payer: Medicare HMO | Admitting: Urology

## 2021-07-06 ENCOUNTER — Other Ambulatory Visit: Payer: Self-pay

## 2021-07-06 ENCOUNTER — Other Ambulatory Visit: Payer: Medicare HMO

## 2021-07-06 ENCOUNTER — Other Ambulatory Visit: Payer: Medicare HMO | Admitting: *Deleted

## 2021-07-06 DIAGNOSIS — Z515 Encounter for palliative care: Secondary | ICD-10-CM

## 2021-07-06 NOTE — Progress Notes (Signed)
[facility] COMMUNITY PALLIATIVE CARE RN NOTE  PATIENT NAME: [name] Goodman DOB: 1932/01/13 MRN: [id number]  PRIMARY CARE PROVIDER: Bridget Hartshorn, NP  RESPONSIBLE PARTY:  Acct ID - Guarantor Home Phone Work Phone Relationship Acct Type  [id number] KYLEEN, VILLATORO918 [phone]  Self P/F     [address], [place], [place] [address]   Covid-19 Pre-screening Negative  PLAN OF CARE and INTERVENTION:  ADVANCE CARE PLANNING/GOALS OF CARE: Goal is for patient to remain in her home and be as comfortable as possible. PATIENT/CAREGIVER EDUCATION: Hospice education vs palliative, symptom management, safe transfers DISEASE STATUS: Joint palliative care visit completed with LCSW, M. Lonon. Initially met with patient's daughter who provided patient update/history, then later visited with patient in her room. Patient has a diagnosis of cervical cancer. In July 2022, she was taken to the ED for c/o constipation. A CT scan was done at that time which showed a cervical mass. Patient had a biopsy done last month which showed it was cancerous. She is not a candidate for chemotherapy or radiation due to her cancer being too far advanced as well as her age. Daughter has noticed worsening dementia. Her appetite has decreased. She has started pocketing food and has seemed to get confused as to whether to sip or chew when switching from drinking fluids to eating food. She is no longer being fed meat to lessen risk of choking. She is eating softer foods. Daughter is only able to get patient to eat about 1 good meal daily. Today she ate a small bowl of oatmeal, a container of applesauce and a few bites of green peas. Daughter states that she has noticed that right before she starts to feed her, she begins to sneeze. This occurs only at mealtimes. Unsure of cause. She has started chewing her medications. Daughter is only focused on administering Zoloft and Seroquel at this time. Her mouth is very dry. Mouth swabs given to  daughter for LCSW as requested. Patient has lost 41 lbs over the past 2 months. Two months ago she was able to ambulate from her bedroom to the living room and able to attend choir rehearsals with her daughter. She now requires 1-2 person assistance with transfers. When sitting up patient is constantly leaning. She is incontinent of both bowel and bladder and wears adult briefs. Her last bowel movement was yesterday. No skin issues at this time. She is sleeping most of the day and no longer interested in activities. She does enjoy listening to music. Patient was lying in bed asleep but aroused with verbal and tactile stimulation. She was able to speak in complete sentences, but voice was very weak. She appears comfortable and denies pain. Daughter was confused because she received a call from [facility]'s hospice division due to a hospice order that was sent in by Lars Mage NP. Daughter thought that palliative and hospice was the same service. Clarified the differences between hospice and palliative care. Daughter is agreeable in transitioning patient to hospice. LCSW made our hospice referral center aware that daughter wants to proceed with hospice order.    HISTORY OF PRESENT ILLNESS: This is a 87 yo female with a diagnosis of cervical cancer. Palliative care team was asked to follow patient but daughter is now agreeable to transition patient to hospice.  CODE STATUS: Full code ADVANCED DIRECTIVES: N MOST FORM: no PPS: 30%   (Duration of visit and documentation 75 minutes)   Daryl Eastern, RN BSN

## 2021-07-07 NOTE — Progress Notes (Signed)
COMMUNITY PALLIATIVE CARE SW NOTE  PATIENT NAME: [name] Goodman DOB: April 24, 1932 MRN: [id number]  PRIMARY CARE PROVIDER: Bridget Hartshorn, NP  RESPONSIBLE PARTY:  Acct ID - Guarantor Home Phone Work Phone Relationship Acct Type  [id number] AJOONI, [phone]  Self P/F     [address], [place], [place] [address]     PLAN OF CARE and INTERVENTIONS:             GOALS OF CARE/ ADVANCE CARE PLANNING:  Goal is for patient to remain in her home and transition to hospice care. SOCIAL/EMOTIONAL/SPIRITUAL ASSESSMENT/ INTERVENTIONS:  SW and RN-M. Howard completed initial visit with patient at her home, where she was present with her daughter/PCG-Terran. The team initially met with patient's daughter separately. SW provided education to her regarding palliative care and hospice services, the differences and services expected under each program. Alden verbalized understanding. After PCG provided a status update on patient, it was determined that a hospice consult may be more appropriate for patient at his time. She advised that patient was diagnosed with cervical cancer that is too far advanced for treatment. Patient is sleeping more and appears to be more withdrawn and uninterested in things she use to enjoy such as watching TV or spending time with family. Her appetite has decreased where she is only eating a few bites of soft foods and sips of Boost. Patient has to be fed. Her daughter reports that patient often seems confused as to whether she should chew or suck (from as straw) and requires cueing and encouragement by her. She is also pocketing foods and have to be encouraged to chew and swallow her food. Patient's most current weight is 96 lbs, which is down from 108 lbs on ept. 1 and 149 lbs on July 1. Patient is total care. She is a 1-2 person assist when being transferred from her bed to the wheelchair, however patient has been bedbound for the past two weeks. The team observed patient in bed,  laying on her side. She aroused to verbal prompts. Her voice seemed weak and faint at times, however she responded to simple yes/no questions. She denied pain or discomfort. Patient's case will be discussed with palliative care medical director to determine hospice eligibility. SW provided supportive counseling, assessment of patient's needs and comfort, coping of caregiver, education regarding hospice/palliative care services, reassurance of support, and left mouth swabs with PCG for use with patient care.  PATIENT/CAREGIVER EDUCATION/ COPING:  SW provided education regarding hospice verses palliative care services; Patient and PCG appear to be coping well as they both have strong spiritual faith and supportive family/friends and spiritual community.  PERSONAL EMERGENCY PLAN:  911 can be activated for emergencies. COMMUNITY RESOURCES COORDINATION/ HEALTH CARE NAVIGATION:  None. FINANCIAL/LEGAL CONCERNS/INTERVENTIONS:  None.     SOCIAL HX:  Social History   Tobacco Use   Smoking status: Never   Smokeless tobacco: Never  Substance Use Topics   Alcohol use: No    CODE STATUS: To be further assessed ADVANCED DIRECTIVES: No MOST FORM COMPLETE: No HOSPICE EDUCATION PROVIDED: Yes  PPS: Patient is alert and oriented to self. She is bedbound and dependent for all ADL's. Patient's appetite has declined to a few bites and sips.   Duration of visit and documentation: 60 minutes  Katheren Puller, LCSW

## 2021-08-16 DEATH — deceased

## 2023-04-22 IMAGING — CT CT ABD-PELV W/ CM
2 of 5 series · 16 of 46 positions shown, 18 images · IV contrast (omnipaque)
Comparison: 03/07/2018

CLINICAL DATA: Lower abdominal pain for 1 week. Personal history of
rectal carcinoma.

EXAM:
CT ABDOMEN AND PELVIS WITH CONTRAST
TECHNIQUE: Multidetector CT imaging of the abdomen and pelvis was performed
using the standard protocol following bolus administration of
intravenous contrast.
CONTRAST:  100mL OMNIPAQUE IOHEXOL 300 MG/ML  SOLN

[Series 3: axial st · axial · 0.82mm/px · z∈[+785,+1175]mm · 13 of 90 slices shown, 15 images]
[im 6/90  soft-tissue]
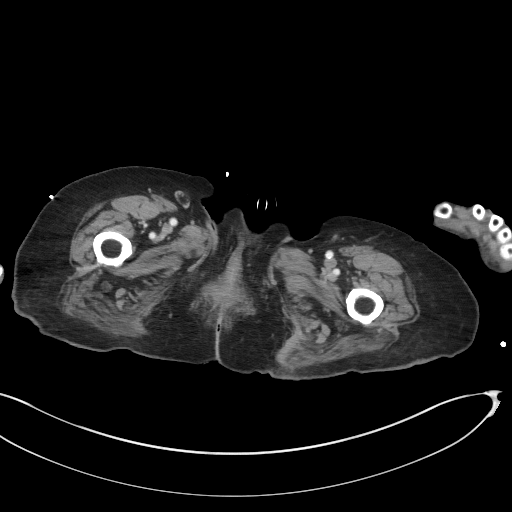
[im 6/90  bone]
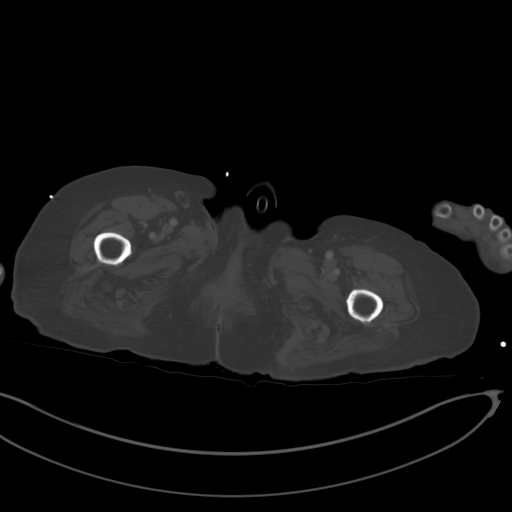
[im 12/90  soft-tissue]
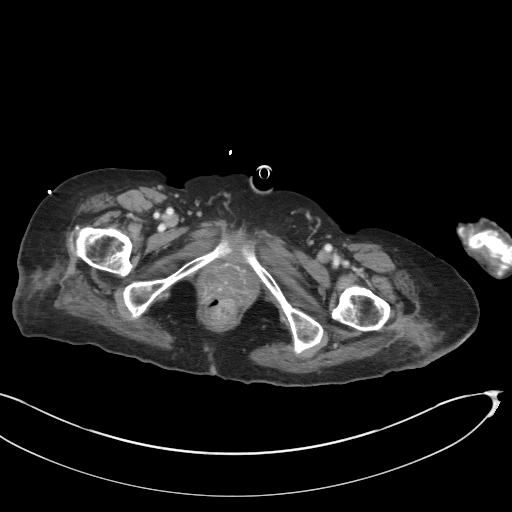
[im 18/90  soft-tissue]
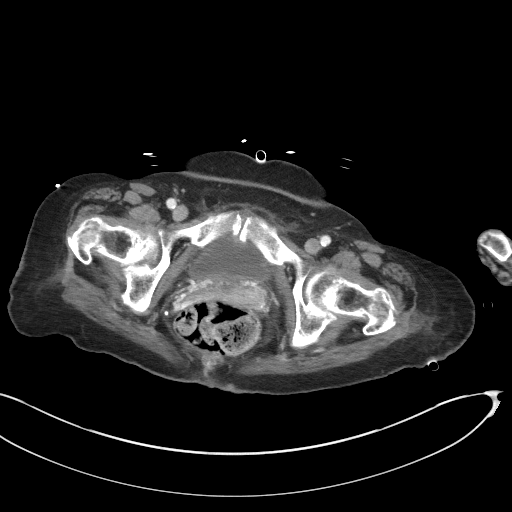
[im 24/90  soft-tissue]
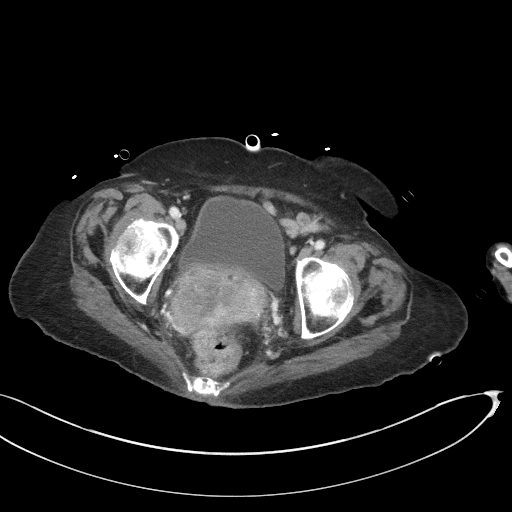
[im 30/90  soft-tissue]
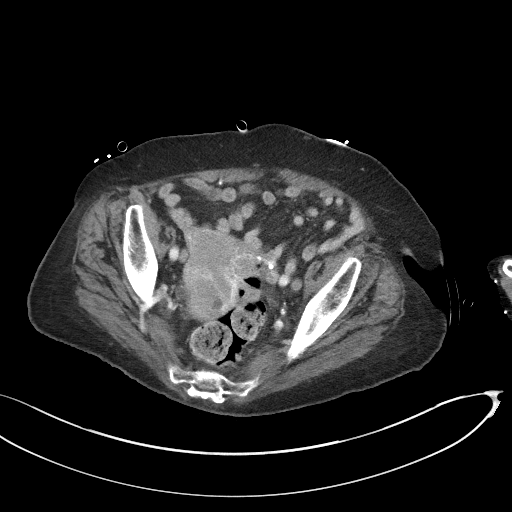
[im 36/90  soft-tissue]
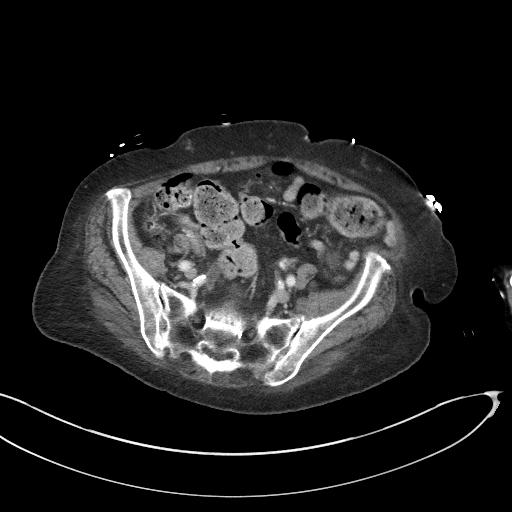
[im 48/90  soft-tissue]
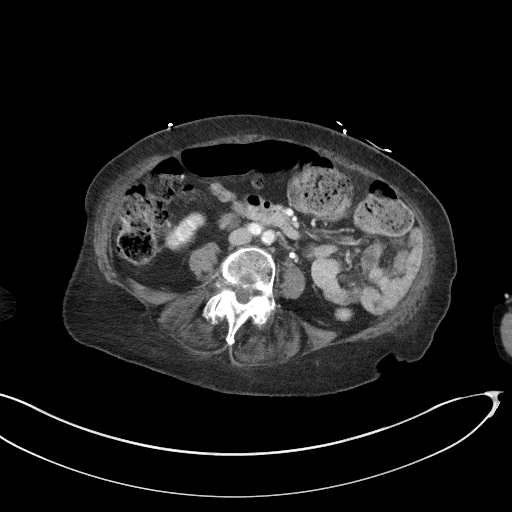
[im 54/90  soft-tissue]
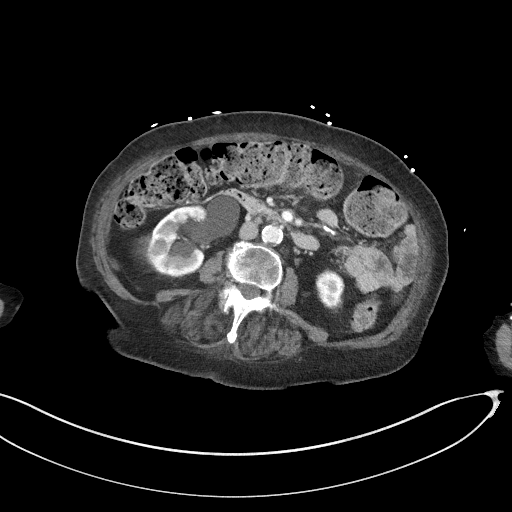
[im 60/90  soft-tissue]
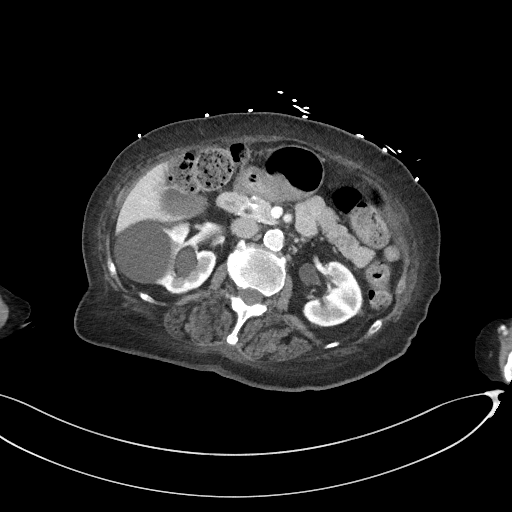
[im 60/90  bone]
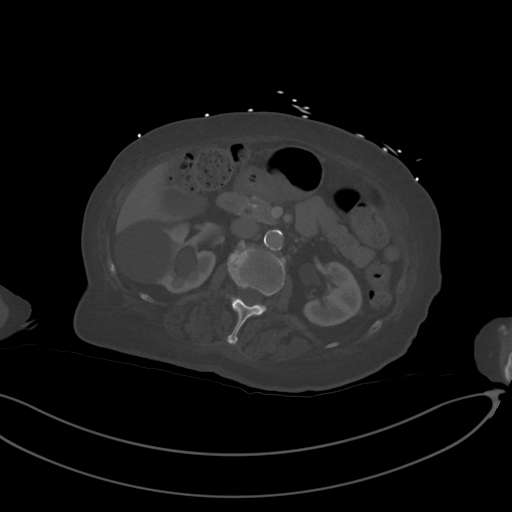
[im 66/90  soft-tissue]
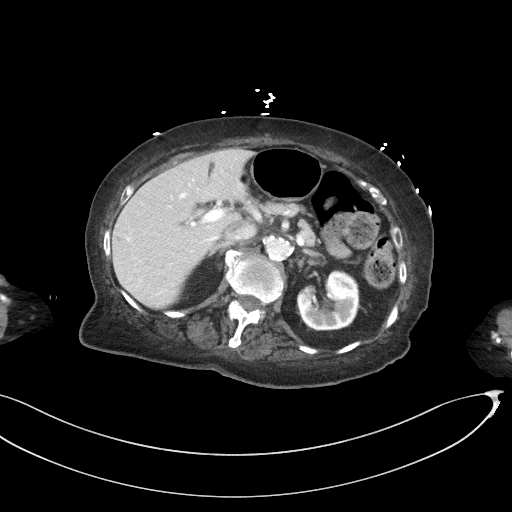
[im 72/90  soft-tissue]
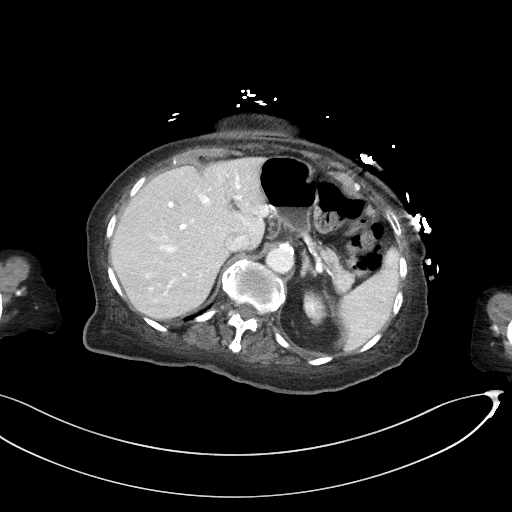
[im 78/90  soft-tissue]
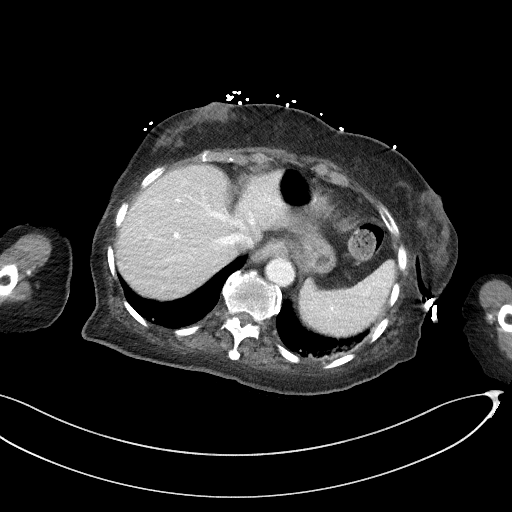
[im 84/90  soft-tissue]
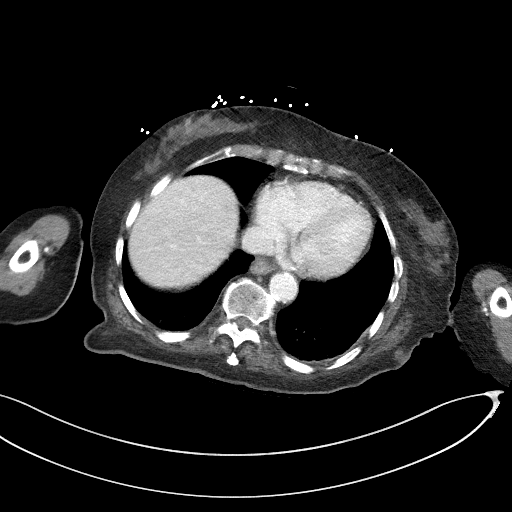

[Series 6: coronal st · coronal · 0.75mm/px · 3 of 87 slices shown]
[im 29/87  soft-tissue]
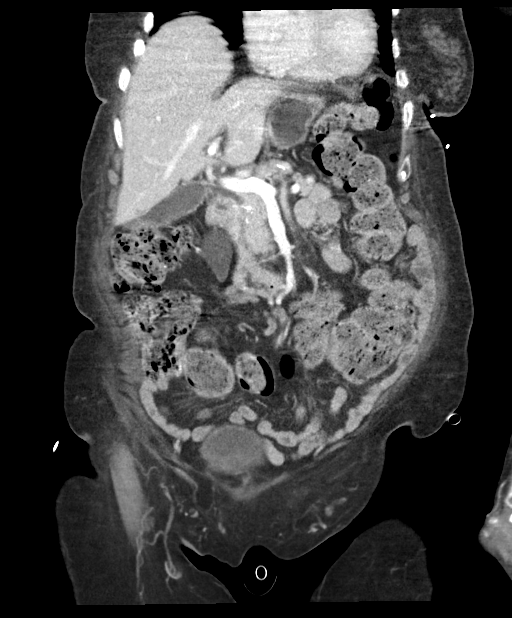
[im 39/87  soft-tissue]
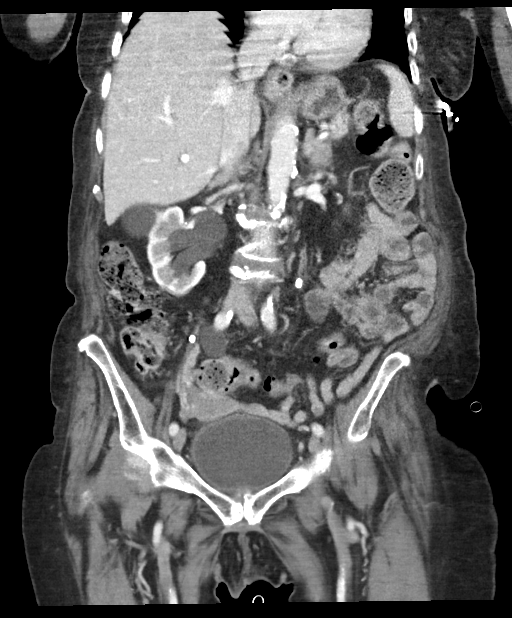
[im 48/87  soft-tissue]
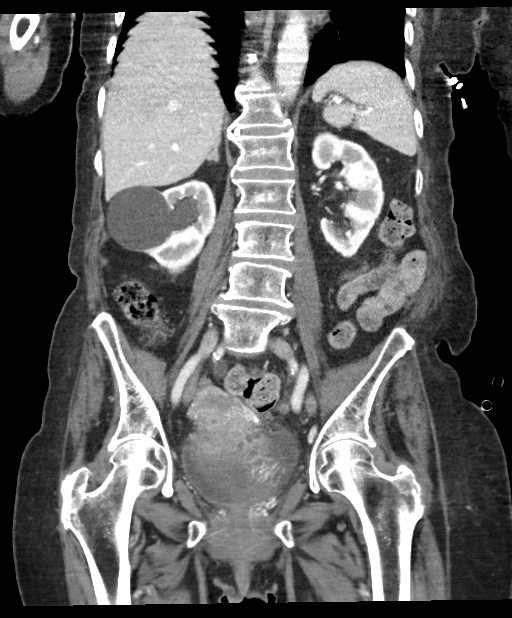

[16 of 46 positions shown; findings below may reference images not displayed]

FINDINGS: Lower Chest: No acute findings.

Hepatobiliary: No hepatic masses identified. Multiple tiny less than
1 cm gallstones are seen, however there is no evidence of
cholecystitis or biliary ductal dilatation.

Pancreas:  No mass or inflammatory changes.

Spleen: Within normal limits in size and appearance.

Adrenals/Urinary Tract: No masses identified. Right renal cysts
again noted, largest measuring 5 cm. Moderate to severe right
hydroureteronephrosis is similar to previous study, however
previously seen distal right ureteral calculus is no longer
visualized. The distal right ureter is engulfed by a large pelvic
mass, as described below. Unremarkable unopacified urinary bladder.

Stomach/Bowel: No evidence of obstruction, inflammatory process or
abnormal fluid collections. Large stool burden noted throughout the
colon.

Vascular/Lymphatic: No pathologically enlarged lymph nodes. No acute
vascular findings. Aortic atherosclerotic calcification noted.

Reproductive: A new large heterogeneous soft tissue mass is seen
which is centered in the uterine cervix, and shows evidence of
extension into the right parametrium. This measures 7.0 x 5.7 by
cm, highly suspicious for cervical carcinoma.

Other:  None.

Musculoskeletal:  No suspicious bone lesions identified.
IMPRESSION: New soft tissue mass centered in the uterine cervix, with extension
into the right parametrium, highly suspicious for cervical
carcinoma.

Moderate to severe right hydroureteronephrosis, with distal right
ureter involved by the cervical mass.

No evidence of abdominal metastatic disease.

Cholelithiasis. No radiographic evidence of cholecystitis.

Large colonic stool burden noted.

Aortic Atherosclerosis ([id number]).

## 2023-04-25 IMAGING — DX DG ABDOMEN ACUTE W/ 1V CHEST
3 series · 3 of 3 positions shown · non-contrast
Comparison: CT 05/14/2021.  Chest x-ray 04/03/2020.

CLINICAL DATA: Abdominal pain.  Constipation.

EXAM:
DG ABDOMEN ACUTE WITH 1 VIEW CHEST

[chest pa]
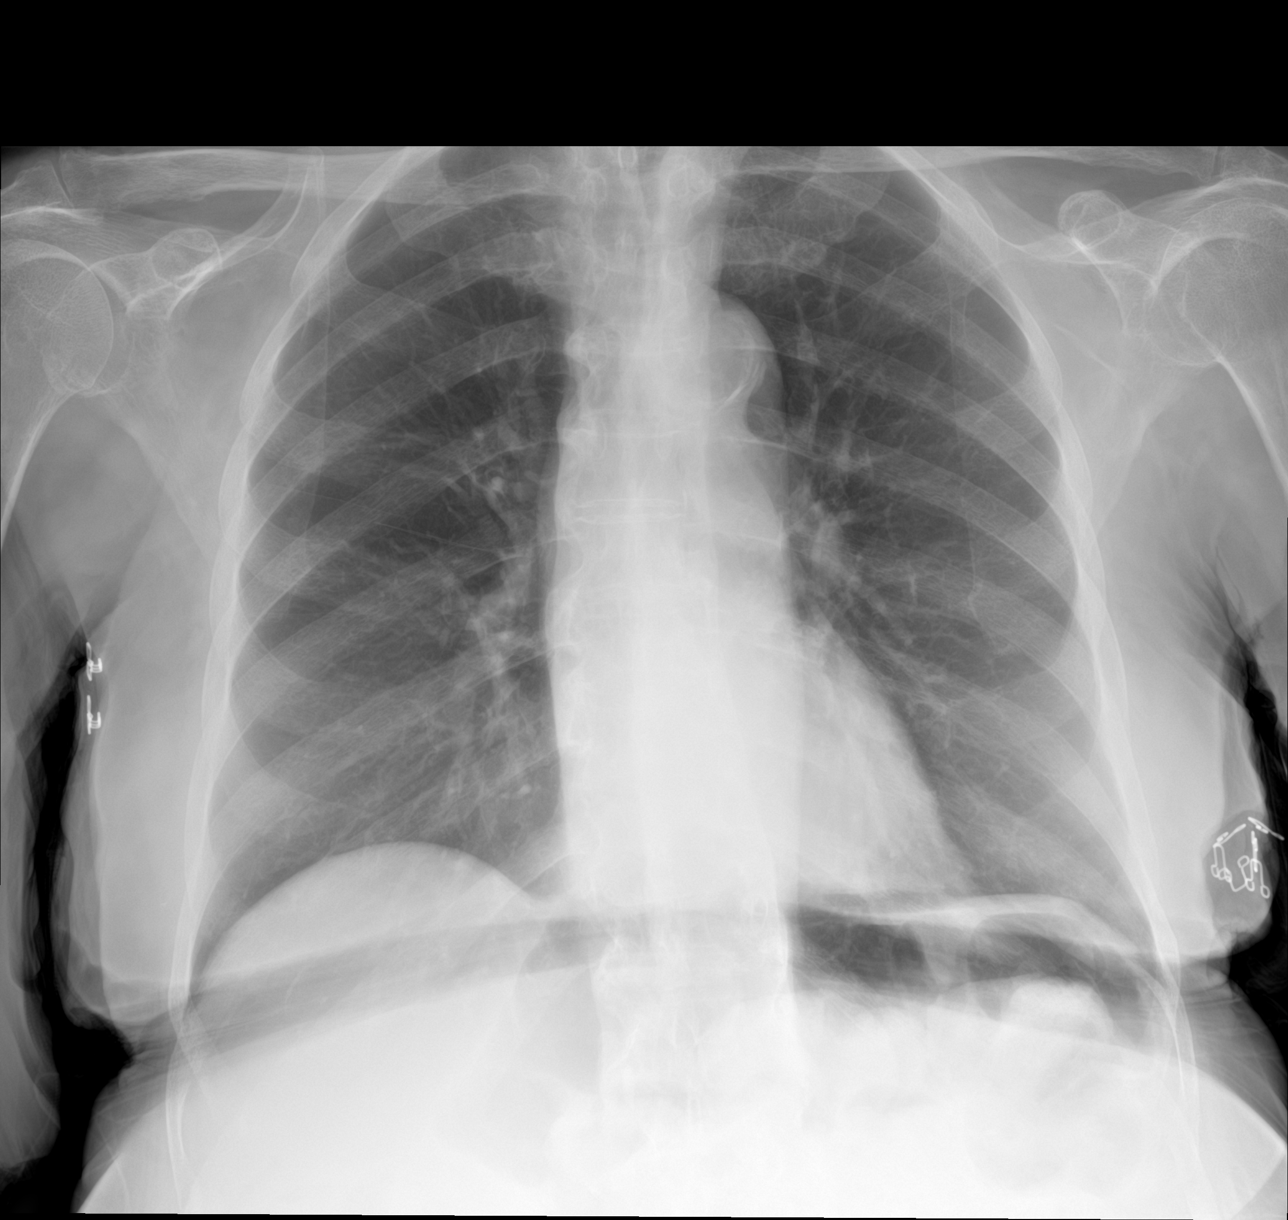

[abdomen erect ap]
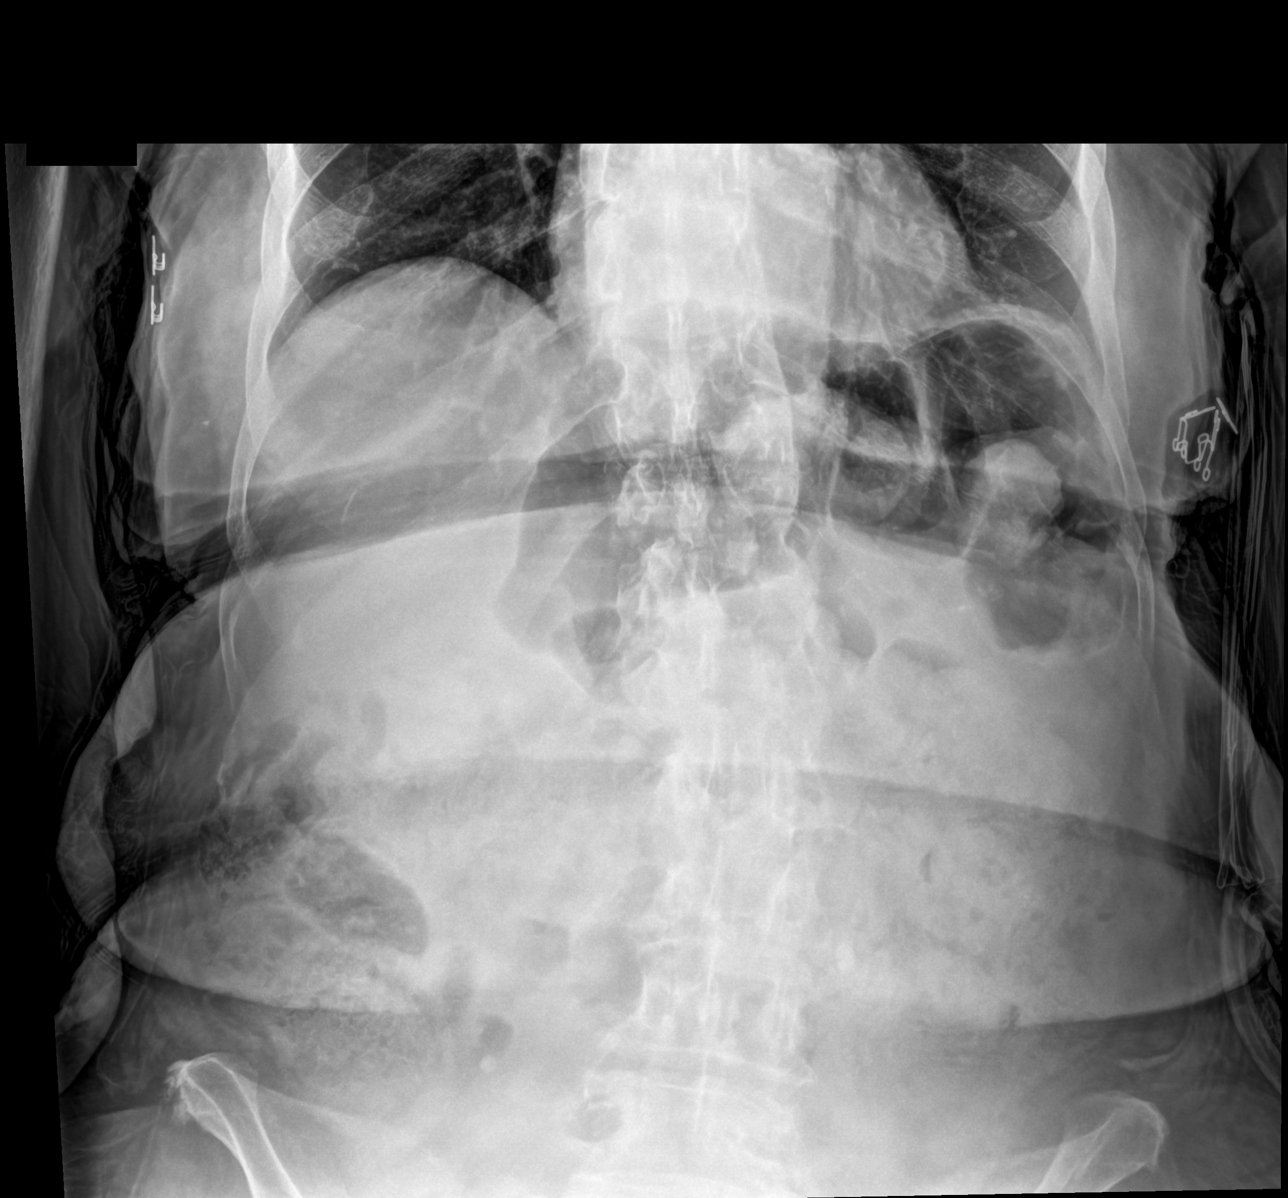

[abdomen supine]
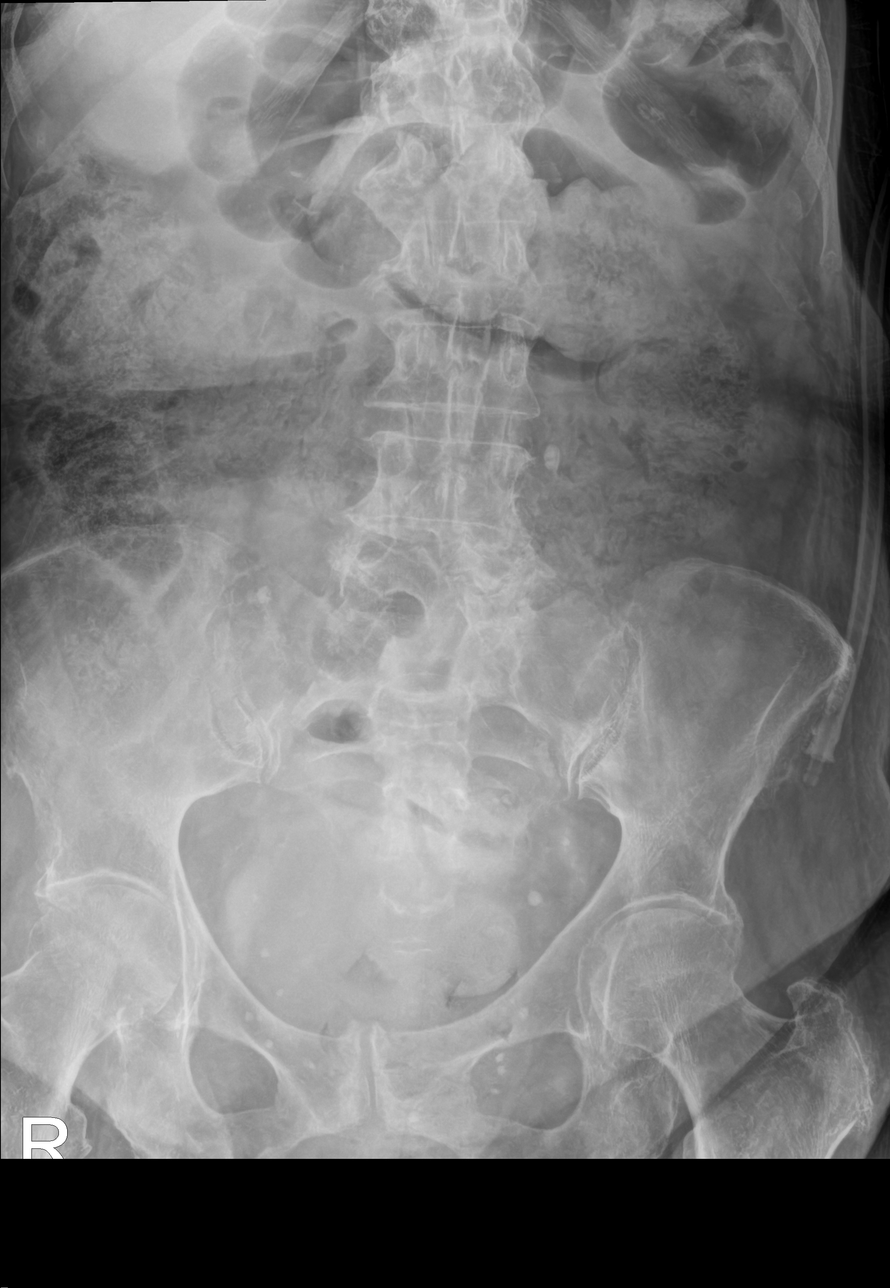

[3 of 3 positions shown; findings below may reference images not displayed]

FINDINGS: Mediastinum and hilar structures normal. Heart size normal. No focal
infiltrate. No pleural effusion or pneumothorax. Degenerative
changes scoliosis thoracic spine.

Soft tissue structures of the abdomen are unremarkable. Large amount
of stool noted throughout the colon suggesting constipation. No
bowel distention or free air. Vascular calcifications again noted in
the abdomen and pelvis. Diffuse osteopenia. Degenerative change
thoracolumbar spine and both hips.
IMPRESSION: 1.  No acute cardiopulmonary disease.

2. Large amount of stool noted throughout the colon again noted.
Findings suggest constipation. No acute intra-abdominal abnormality
identified.
